# Patient Record
Sex: Male | Born: 1970 | Race: Black or African American | Hispanic: No | Smoking: Current every day smoker
Health system: Southern US, Community
[De-identification: ages and names within clinical notes are randomized; demographics above are authoritative.]

## PROBLEM LIST (undated history)

## (undated) HISTORY — PX: MANDIBLE SURGERY: SHX707

---

## 2016-10-09 ENCOUNTER — Emergency Department
Admission: EM | Admit: 2016-10-09 | Discharge: 2016-10-09 | Discharge status: Against Medical Advice | Payer: Self-pay | Attending: Emergency Medicine | Admitting: Emergency Medicine

## 2016-10-09 DIAGNOSIS — Z5321 Procedure and treatment not carried out due to patient leaving prior to being seen by health care provider: Secondary | ICD-10-CM | POA: Insufficient documentation

## 2016-10-09 DIAGNOSIS — R6884 Jaw pain: Secondary | ICD-10-CM | POA: Insufficient documentation

## 2016-10-09 MED ORDER — OXYCODONE-ACETAMINOPHEN 5-325 MG PO TABS
ORAL_TABLET | ORAL | Status: AC
  Filled 2016-10-09: qty 1

## 2016-10-09 MED ORDER — OXYCODONE-ACETAMINOPHEN 5-325 MG PO TABS
1.0000 | ORAL_TABLET | Freq: Once | ORAL | Status: AC
  Administered 2016-10-09: 1 via ORAL

## 2016-10-09 NOTE — ED Notes (Signed)
Pt called in lobby x 1 

## 2016-10-09 NOTE — ED Triage Notes (Signed)
Patient reports having jaw pain.  Patient reports pain in lower jaw from midline up into right ear.

## 2016-10-15 ENCOUNTER — Encounter: Payer: Self-pay | Admitting: Emergency Medicine

## 2016-10-15 ENCOUNTER — Emergency Department
Admission: EM | Admit: 2016-10-15 | Discharge: 2016-10-15 | Disposition: A | Discharge status: Home / Self Care | Payer: Self-pay | Attending: Emergency Medicine | Admitting: Emergency Medicine

## 2016-10-15 DIAGNOSIS — R6884 Jaw pain: Secondary | ICD-10-CM | POA: Insufficient documentation

## 2016-10-15 DIAGNOSIS — Z87891 Personal history of nicotine dependence: Secondary | ICD-10-CM | POA: Insufficient documentation

## 2016-10-15 MED ORDER — OXYCODONE-ACETAMINOPHEN 5-325 MG PO TABS
1.0000 | ORAL_TABLET | Freq: Once | ORAL | Status: AC
  Administered 2016-10-15: 1 via ORAL
  Filled 2016-10-15: qty 1

## 2016-10-15 MED ORDER — TRAMADOL HCL 50 MG PO TABS: 50.0000 mg | ORAL_TABLET | Freq: Four times a day (QID) | ORAL | 0 refills | Status: AC | PRN

## 2016-10-15 MED ORDER — TRAMADOL HCL 50 MG PO TABS: 50.0000 mg | ORAL_TABLET | Freq: Four times a day (QID) | ORAL | 0 refills | Status: DC | PRN

## 2016-10-15 MED ORDER — KETOROLAC TROMETHAMINE 60 MG/2ML IM SOLN: 60.0000 mg | Freq: Once | INTRAMUSCULAR | Status: DC

## 2016-10-15 NOTE — ED Provider Notes (Signed)
[facility] Provider Note  ____________________________________________  Time seen: Approximately 7:39 PM  I have reviewed the triage vital signs and the nursing notes.   HISTORY  Chief Complaint Jaw Pain    HPI Eddie Gardner is a 46 y.o. male presenting to the emergency department with chronic jaw pain. As I entered the room patient states, "I need something for pain". Patient states that he sustained multiple jaw fractures approximately 2 years ago and had bracing. He has not followed up with his oral surgeon in "several years". Patient states that he experiences 10 out of 10 jaw pain daily. He has been seeking care at numerous emergency departments in the attempts of obtaining Percocet. Patient has not yet established a pain management contract. Patient is interested in being referred to pain management. He has been afebrile. Patient denies increased gingival edema or erythema. Patient denies chest pain, chest tightness, shortness of breath,  nausea, abdominal pain and upper extremity pain.   History reviewed. No pertinent past medical history.  There are no active problems to display for this patient.   Past Surgical History:  Procedure Laterality Date  . MANDIBLE SURGERY      Prior to Admission medications   Medication Sig Start Date End Date Taking? Authorizing Provider  traMADol (ULTRAM) 50 MG tablet Take 1 tablet (50 mg total) by mouth every 6 (six) hours as needed. 10/15/16 10/18/16  Orvil Feil, PA-C    Allergies Patient has no known allergies.  No family history on file.  Social History Social History  Substance Use Topics  . Smoking status: Former Games developer  . Smokeless tobacco: Never Used  . Alcohol use No     Review of Systems  Constitutional: No fever/chills Eyes: No visual changes. No discharge ENT: No upper respiratory complaints. Cardiovascular: no chest pain. Respiratory: no cough. No  SOB. Gastrointestinal: No abdominal pain.  No nausea, no vomiting.  No diarrhea.  No constipation. Musculoskeletal: Patient has lower jaw pain.  Skin: Negative for rash, abrasions, lacerations, ecchymosis. Neurological: Negative for headaches, focal weakness or numbness. ____________________________________________   PHYSICAL EXAM:  VITAL SIGNS: ED Triage Vitals  Enc Vitals Group     BP 10/15/16 1839 133/63     Pulse Rate 10/15/16 1839 83     Resp 10/15/16 1839 18     Temp 10/15/16 1839 98.2 F (36.8 C)     Temp src --      SpO2 10/15/16 1839 99 %     Weight 10/15/16 1838 170 lb (77.1 kg)     Height 10/15/16 1838 5\' 10"  (1.778 m)     Head Circumference --      Peak Flow --      Pain Score 10/15/16 1838 10     Pain Loc --      Pain Edu? --      Excl. in GC? --     Constitutional: Alert and oriented. Patient is antagonistic and anxious throughout exam. Eyes: Palpebral and bulbar conjunctiva are nonerythematous bilaterally. PERRL. EOMI. No scleral icterus bilaterally. Head: Atraumatic. ENT:      Ears: Tympanic membranes are pearly bilaterally without effusion, erythema or purulent exudate. Bony landmarks are visualized bilaterally.       Nose: Skin overlying nares is without erythema. Nasal turbinates are non-erythematous. Nasal septum is midline.      Mouth/Throat: Mucous membranes are moist. Posterior pharynx is nonerythematous. No tonsillar exudate, hypertrophy or petechiae visualized. Uvula is midline. No gingival hypertrophy or  edema visualized. Teeth from inferior 32 to inferior 28 are missing. Patient has tenderness to palpation at inferior right jaw. Neck: Full range of motion. No pain with neck flexion. Hematological/Lymphatic/Immunilogical: No cervical lymphadenopathy.  Cardiovascular: No pain with palpation over the anterior and posterior chest wall. Normal rate, regular rhythm. Normal S1 and S2. No murmurs, gallops or rubs auscultated.  Respiratory: Trachea is  midline. No retractions or presence of deformity. Thoracic expansion is symmetric with unaccentuated tactile fremitus. Resonant and symmetric percussion tones bilaterally. On auscultation, adventitious sounds are absent.  Neurologic:  Normal for age. No gross focal neurologic deficits are appreciated.  Skin: Patient has no erythema or edema of the skin overlying the right jaw. No clubbing or cyanosis of the digits visualized.  Psychiatric: Mood and affect are normal for age. Speech and behavior are normal.   ____________________________________________   LABS (all labs ordered are listed, but only abnormal results are displayed)  Labs Reviewed - No data to display ____________________________________________  EKG   ____________________________________________  RADIOLOGY  No results found.  ____________________________________________    PROCEDURES  Procedure(s) performed:    Procedures    Medications  oxyCODONE-acetaminophen (PERCOCET/ROXICET) 5-325 MG per tablet 1 tablet (1 tablet Oral Given 10/15/16 2012)     ____________________________________________   INITIAL IMPRESSION / ASSESSMENT AND PLAN / ED COURSE  Pertinent labs & imaging results that were available during my care of the patient were reviewed by me and considered in my medical decision making (see chart for details).  Review of the [place] CSRS was performed in accordance of the [facility] prior to dispensing any controlled drugs.     Assessment and Plan: Chronic jaw pain: Patient presents to the emergency department with chronic jaw pain. After I discussed anti-inflammatory options with patient, he sought counsel with me at my desk outside of his exam room. Patient became very demanding for narcotics. I explained that I would provide one Percocet in the emergency department today and a 3 day course of tramadol to sustain his pain management needs until he can establish a relationship with a pain management  affiliation. I explained that [facility] emergency department was not obligated to provide narcotics for chronic pain. Patient voiced understanding. A referral was made to pain management. All patient questions were answered.  ___________________________________________  FINAL CLINICAL IMPRESSION(S) / ED DIAGNOSES  Final diagnoses:  Jaw pain      NEW MEDICATIONS STARTED DURING THIS VISIT:  Discharge Medication List as of 10/15/2016  7:51 PM          This chart was dictated using voice recognition software/Dragon. Despite best efforts to proofread, errors can occur which can change the meaning. Any change was purely unintentional.    Orvil FeilJaclyn M Marye Eagen, PA-C 10/15/16 2344    Arnaldo NatalPaul F Malinda, MD 10/16/16 [phone]

## 2016-10-15 NOTE — ED Notes (Signed)

## 2016-10-15 NOTE — ED Triage Notes (Addendum)
Pt c/o jaw pain. States every time "it gets cold and it rains it hurts". Pt reports hx of multiple fractures to R jaw where pain is.

## 2016-12-11 ENCOUNTER — Encounter: Payer: Self-pay | Admitting: *Deleted

## 2016-12-11 DIAGNOSIS — Z5321 Procedure and treatment not carried out due to patient leaving prior to being seen by health care provider: Secondary | ICD-10-CM | POA: Insufficient documentation

## 2016-12-11 DIAGNOSIS — K0889 Other specified disorders of teeth and supporting structures: Secondary | ICD-10-CM | POA: Insufficient documentation

## 2016-12-11 DIAGNOSIS — F17200 Nicotine dependence, unspecified, uncomplicated: Secondary | ICD-10-CM | POA: Insufficient documentation

## 2016-12-11 MED ORDER — IBUPROFEN 400 MG PO TABS
400.0000 mg | ORAL_TABLET | Freq: Once | ORAL | Status: AC | PRN
  Administered 2016-12-11: 400 mg via ORAL

## 2016-12-11 MED ORDER — IBUPROFEN 400 MG PO TABS
ORAL_TABLET | ORAL | Status: AC
  Filled 2016-12-11: qty 1

## 2016-12-11 NOTE — ED Triage Notes (Signed)
Pt has left lower toothache  Sx for 2 months.  Pt states pain worse today.  Pt alert.

## 2016-12-12 ENCOUNTER — Emergency Department
Admission: EM | Admit: 2016-12-12 | Discharge: 2016-12-13 | Disposition: A | Discharge status: Home / Self Care | Payer: Self-pay | Attending: Emergency Medicine | Admitting: Emergency Medicine

## 2016-12-12 NOTE — ED Notes (Signed)
Called pt to be taken to [facility]; no answer and pt not found in lobby.

## 2016-12-16 ENCOUNTER — Emergency Department
Admission: EM | Admit: 2016-12-16 | Discharge: 2016-12-16 | Disposition: A | Discharge status: Home / Self Care | Payer: Self-pay | Attending: Emergency Medicine | Admitting: Emergency Medicine

## 2016-12-16 DIAGNOSIS — K029 Dental caries, unspecified: Secondary | ICD-10-CM | POA: Insufficient documentation

## 2016-12-16 DIAGNOSIS — F17200 Nicotine dependence, unspecified, uncomplicated: Secondary | ICD-10-CM | POA: Insufficient documentation

## 2016-12-16 MED ORDER — LIDOCAINE VISCOUS 2 % MT SOLN
15.0000 mL | Freq: Once | OROMUCOSAL | Status: DC
Start: 2016-12-16 — End: 2016-12-16

## 2016-12-16 MED ORDER — AMOXICILLIN 500 MG PO TABS: 500.0000 mg | ORAL_TABLET | Freq: Two times a day (BID) | ORAL | 0 refills | Status: AC

## 2016-12-16 MED ORDER — IBUPROFEN 800 MG PO TABS: 800.0000 mg | ORAL_TABLET | Freq: Three times a day (TID) | ORAL | 0 refills | Status: DC | PRN

## 2016-12-16 MED ORDER — AMOXICILLIN 500 MG PO CAPS
500.0000 mg | ORAL_CAPSULE | Freq: Once | ORAL | Status: AC
  Administered 2016-12-16: 500 mg via ORAL
  Filled 2016-12-16: qty 1

## 2016-12-16 MED ORDER — LIDOCAINE VISCOUS 2 % MT SOLN
15.0000 mL | Freq: Once | OROMUCOSAL | Status: AC
  Administered 2016-12-16: 15 mL via OROMUCOSAL
  Filled 2016-12-16: qty 15

## 2016-12-16 MED ORDER — KETOROLAC TROMETHAMINE 10 MG PO TABS
10.0000 mg | ORAL_TABLET | Freq: Once | ORAL | Status: AC
  Administered 2016-12-16: 10 mg via ORAL
  Filled 2016-12-16: qty 1

## 2016-12-16 NOTE — ED Notes (Signed)
Dr Brown and Butch, RN at bedside at this time. 

## 2016-12-16 NOTE — ED Provider Notes (Signed)
[facility] Emergency Department Provider Note   First MD Initiated Contact with Patient 12/16/16 [phone]     (approximate)  I have reviewed the triage vital signs and the nursing notes.   HISTORY  Chief Complaint Dental Pain    HPI Eddie Gardner is a 46 y.o. male presents with left mandibular molar dental carry times few months with intermittent pain. Patient states this episode of pain has been severe tonight with a pain score is 10 out of 10. Patient denies any fever no difficulty swallowing.   Past medical history None There are no active problems to display for this patient.   Past Surgical History:  Procedure Laterality Date  . MANDIBLE SURGERY      Prior to Admission medications   Not on File    Allergies No known drug allergies History reviewed. No pertinent family history.  Social History Social History  Substance Use Topics  . Smoking status: Current Every Day Smoker  . Smokeless tobacco: Never Used  . Alcohol use No    Review of Systems Constitutional: No fever/chills Eyes: No visual changes. ENT: No sore throat.Positive for dental pain Cardiovascular: Denies chest pain. Respiratory: Denies shortness of breath. Gastrointestinal: No abdominal pain.  No nausea, no vomiting.  No diarrhea.  No constipation. Genitourinary: Negative for dysuria. Musculoskeletal: Negative for back pain. Skin: Negative for rash. Neurological: Negative for headaches, focal weakness or numbness.  10-point ROS otherwise negative.  ____________________________________________   PHYSICAL EXAM:  VITAL SIGNS: ED Triage Vitals  Enc Vitals Group     BP 12/16/16 0422 (!) 151/101     Pulse Rate 12/16/16 0422 (!) 54     Resp 12/16/16 0422 18     Temp 12/16/16 0422 98 F (36.7 C)     Temp Source 12/16/16 0422 Oral     SpO2 12/16/16 0422 98 %     Weight 12/16/16 0423 180 lb (81.6 kg)     Height 12/16/16 0423  (1.803 m)     Head  Circumference --      Peak Flow --      Pain Score 12/16/16 0422 10     Pain Loc --      Pain Edu? --      Excl. in GC? --     Constitutional: Alert and oriented. Well appearing and in no acute distress. Eyes: Conjunctivae are normal. PERRL. EOMI. Head: Atraumatic. Mouth/Throat: Mucous membranes are moist. Oropharynx non-erythematous.Left mandibular molar dental caries Neck: No stridor.  Cardiovascular: Normal rate, regular rhythm. Good peripheral circulation. Grossly normal heart sounds. Respiratory: Normal respiratory effort.  No retractions. Lungs CTAB. Gastrointestinal: Soft and nontender. No distention.  Musculoskeletal: No lower extremity tenderness nor edema. No gross deformities of extremities. Neurologic:  Normal speech and language. No gross focal neurologic deficits are appreciated.  Skin:  Skin is warm, dry and intact. No rash noted.     Procedures   ____________________________________________   INITIAL IMPRESSION / ASSESSMENT AND PLAN / ED COURSE  Pertinent labs & imaging results that were available during my care of the patient were reviewed by me and considered in my medical decision making (see chart for details).  Patient given viscous lidocaine swish and spit Toradol 10 mg tablet as well as amoxicillin 5 mg.      ____________________________________________  FINAL CLINICAL IMPRESSION(S) / ED DIAGNOSES  Final diagnoses:  None     MEDICATIONS GIVEN DURING THIS VISIT:  Medications  lidocaine (XYLOCAINE) 2 % viscous mouth solution  15 mL (not administered)  lidocaine (XYLOCAINE) 2 % viscous mouth solution 15 mL (15 mLs Mouth/Throat Given 12/16/16 0532)  amoxicillin (AMOXIL) capsule 500 mg (500 mg Oral Given 12/16/16 0532)  ketorolac (TORADOL) tablet 10 mg (10 mg Oral Given 12/16/16 0532)     NEW OUTPATIENT MEDICATIONS STARTED DURING THIS VISIT:  New Prescriptions   No medications on file    Modified Medications   No medications on file     Discontinued Medications   No medications on file     Note:  This document was prepared using Dragon voice recognition software and may include unintentional dictation errors.    Darci Current, MD 12/16/16 [phone]

## 2016-12-16 NOTE — ED Triage Notes (Signed)
Patient c/o lower left jaw pain for the last few months with increasing severity today.

## 2016-12-26 ENCOUNTER — Emergency Department
Admission: EM | Admit: 2016-12-26 | Discharge: 2016-12-26 | Disposition: A | Discharge status: Home / Self Care | Payer: Self-pay | Attending: Emergency Medicine | Admitting: Emergency Medicine

## 2016-12-26 DIAGNOSIS — K029 Dental caries, unspecified: Secondary | ICD-10-CM | POA: Insufficient documentation

## 2016-12-26 DIAGNOSIS — F17200 Nicotine dependence, unspecified, uncomplicated: Secondary | ICD-10-CM | POA: Insufficient documentation

## 2016-12-26 MED ORDER — ACETAMINOPHEN-CODEINE #3 300-30 MG PO TABS
ORAL_TABLET | ORAL | Status: AC
  Filled 2016-12-26: qty 1

## 2016-12-26 MED ORDER — ACETAMINOPHEN-CODEINE #3 300-30 MG PO TABS
1.0000 | ORAL_TABLET | Freq: Once | ORAL | Status: AC
  Administered 2016-12-26: 1 via ORAL

## 2016-12-26 MED ORDER — CLINDAMYCIN HCL 300 MG PO CAPS: 300.0000 mg | ORAL_CAPSULE | Freq: Three times a day (TID) | ORAL | 0 refills | Status: AC

## 2016-12-26 MED ORDER — ACETAMINOPHEN-CODEINE #2 300-15 MG PO TABS: 1.0000 | ORAL_TABLET | ORAL | 0 refills | Status: AC | PRN

## 2016-12-26 MED ORDER — LIDOCAINE VISCOUS 2 % MT SOLN: 10.0000 mL | OROMUCOSAL | 0 refills | Status: DC | PRN

## 2016-12-26 NOTE — ED Provider Notes (Signed)
[facility] Emergency Department Provider Note  ____________________________________________  Time seen: Approximately 10:24 PM  I have reviewed the triage vital signs and the nursing notes.   HISTORY  Chief Complaint Dental Pain    HPI Eddie [facility] is a 46 y.o. male that presents to the emergency department with lower left tooth pain that has increased in severity over the last week. Patient states that he was seen in ED 10 days ago for a fractured tooth and was given amoxicillin and ibuprofen for pain. Patient states that he has taken all of the amoxicillin and has been "popping ibuprofen like candy." Patient has made an appointment with a dentist but is not able to be seen for 2 weeks. He just wants his tooth pulled or something to make the pain go away. He denies fever, drainage from mouth, difficulty opening or closing mouth, shortness of breath, chest pain, nausea, vomiting, abdominal pain.   No past medical history on file.  There are no active problems to display for this patient.   Past Surgical History:  Procedure Laterality Date  . MANDIBLE SURGERY      Prior to Admission medications   Medication Sig Start Date End Date Taking? Authorizing Provider  acetaminophen-codeine (TYLENOL #2) 300-15 MG tablet Take 1 tablet by mouth every 4 (four) hours as needed for moderate pain. 12/26/16 12/26/17  Enid Derry, PA-C  amoxicillin (AMOXIL) 500 MG tablet Take 1 tablet (500 mg total) by mouth 2 (two) times daily. 12/16/16 12/26/16  Darci Current, MD  clindamycin (CLEOCIN) 300 MG capsule Take 1 capsule (300 mg total) by mouth 3 (three) times daily. 12/26/16 01/05/17  Enid Derry, PA-C  ibuprofen (ADVIL,MOTRIN) 800 MG tablet Take 1 tablet (800 mg total) by mouth every 8 (eight) hours as needed. 12/16/16   Darci Current, MD  lidocaine (XYLOCAINE) 2 % solution Use as directed 10 mLs in the mouth or throat as needed for mouth pain. 12/26/16   Enid Derry, PA-C     Allergies Patient has no known allergies.  No family history on file.  Social History Social History  Substance Use Topics  . Smoking status: Current Every Day Smoker  . Smokeless tobacco: Never Used  . Alcohol use No     Review of Systems  Constitutional: No fever/chills Cardiovascular: No chest pain. Respiratory: No cough. No SOB. Gastrointestinal: No abdominal pain.  No nausea, no vomiting.  Musculoskeletal: Negative for musculoskeletal pain. Skin: Negative for rash, abrasions, lacerations, ecchymosis. Neurological: Negative for headaches, numbness or tingling   ____________________________________________   PHYSICAL EXAM:  VITAL SIGNS: ED Triage Vitals  Enc Vitals Group     BP 12/26/16 2009 110/72     Pulse Rate 12/26/16 2009 (!) 51     Resp 12/26/16 2009 20     Temp 12/26/16 2009 98.8 F (37.1 C)     Temp Source 12/26/16 2009 Oral     SpO2 12/26/16 2009 95 %     Weight --      Height --      Head Circumference --      Peak Flow --      Pain Score 12/26/16 2008 10     Pain Loc --      Pain Edu? --      Excl. in GC? --      Constitutional: Alert and oriented.  Eyes: Conjunctivae are normal. PERRL. EOMI. Head: Atraumatic. ENT:      Ears:      Nose:  No congestion/rhinnorhea.      Mouth/Throat: Mucous membranes are moist. Large cavities in lower left molars. No swelling. No TMJ pain. No drainage from mouth. Neck: No stridor.  Cardiovascular: Normal rate, regular rhythm.  Good peripheral circulation. Respiratory: Normal respiratory effort without tachypnea or retractions. Lungs CTAB. Good air entry to the bases with no decreased or absent breath sounds. Musculoskeletal: Full range of motion to all extremities. No gross deformities appreciated. Neurologic:  Normal speech and language. No gross focal neurologic deficits are appreciated.  Skin:  Skin is warm, dry and intact. No rash noted. Psychiatric:  Agitated   ____________________________________________   LABS (all labs ordered are listed, but only abnormal results are displayed)  Labs Reviewed - No data to display ____________________________________________  EKG   ____________________________________________  RADIOLOGY  No results found.  ____________________________________________    PROCEDURES  Procedure(s) performed:    Procedures    Medications  acetaminophen-codeine (TYLENOL #3) 300-30 MG per tablet 1 tablet (1 tablet Oral Given 12/26/16 2220)     ____________________________________________   INITIAL IMPRESSION / ASSESSMENT AND PLAN / ED COURSE  Pertinent labs & imaging results that were available during my care of the patient were reviewed by me and considered in my medical decision making (see chart for details).  Review of the [place] CSRS was performed in accordance of the [facility] prior to dispensing any controlled drugs.   Patient's diagnosis is consistent with dental caries. Vital signs and exam are reassuring. Patient is very agitated and angry in ED. He threatened to punch a wall. At one point patient blocked the door from me leaving. Security was notified and was present with the patient and I. Patient states that he is "sick of telling everybody about his tooth and he is not going to continue repeating his story." Patient will be given a very short course of Tylenol 3 since he is taking excess ibuprofen and I am concerned for his stomach. Patient was instructed that he needs to call the dentist tomorrow and will not be given any more pain medicine from the ED. Patient has not been prescribed narcotics in the last 6 months according to the [facility] CSRS. Patient states that pain is worsening after finishing course of amoxicillin so he was switched to clindamycin. Patient will be discharged home with prescriptions for viscous lidocaine, short course of Tylenol 3, clindamycin. Patient is to follow up with  dentist as directed. Patient is given ED precautions to return to the ED for any worsening or new symptoms.     ____________________________________________  FINAL CLINICAL IMPRESSION(S) / ED DIAGNOSES  Final diagnoses:  Dental caries      NEW MEDICATIONS STARTED DURING THIS VISIT:  Discharge Medication List as of 12/26/2016 10:13 PM    START taking these medications   Details  acetaminophen-codeine (TYLENOL #2) 300-15 MG tablet Take 1 tablet by mouth every 4 (four) hours as needed for moderate pain., Starting Tue 12/26/2016, Until Wed 12/26/2017, Print    clindamycin (CLEOCIN) 300 MG capsule Take 1 capsule (300 mg total) by mouth 3 (three) times daily., Starting Tue 12/26/2016, Until Fri 01/05/2017, Print    lidocaine (XYLOCAINE) 2 % solution Use as directed 10 mLs in the mouth or throat as needed for mouth pain., Starting Tue 12/26/2016, Print            This chart was dictated using voice recognition software/Dragon. Despite best efforts to proofread, errors can occur which can change the meaning. Any change was purely unintentional.  Enid Derry, PA-C 12/26/16 2356    Phineas Semen, MD 12/27/16 [phone]

## 2016-12-26 NOTE — Discharge Instructions (Signed)
OPTIONS FOR DENTAL FOLLOW UP CARE ° °[place] Department of Health and Human Services - Local Safety Net Dental Clinics °http://www.ncdhhs.gov/dph/oralhealth/services/safetynetclinics.htm °  °[facility] (336-562-3123) ° °[facility] [place] ([phone]) ° °[facility] ([phone] ext [phone]) ° °[facility] ([phone]) ° °[facility] ([phone]) °This clinic caters to the indigent population and is on a lottery system. °Location: °[facility], [address], [place] °Clinic Hours: °Wednesdays from 6pm - 9pm, patients seen by a lottery system. °For dates, call or go to [email or web address].[facility].edu/[facility]/patients/Dental-[facility] °Services: °Cleanings, fillings and simple extractions. °Payment Options: °DENTAL WORK IS FREE OF CHARGE. Bring proof of income or support. °Best way to get seen: °Arrive at 5:15 pm - this is a lottery, NOT first come/first serve, so arriving earlier will not increase your chances of being seen. °  °  °[facility] °[phone] °Select option 1 for emergencies °  °Location: °[facility], [address], [place] °Clinic Hours: °No walk-ins accepted - call the day before to schedule an appointment. °Check in times are 9:30 am and 1:30 pm. °Services: °Simple extractions, temporary fillings, pulpectomy/pulp debridement, uncomplicated abscess drainage. °Payment Options: °PAYMENT IS DUE AT THE TIME OF SERVICE.  Fee is usually $100-200, additional surgical procedures (e.g. abscess drainage) may be extra. °Cash, checks, Visa/MasterCard accepted.  Can file Medicaid if patient is covered for dental - patient should call case worker to check. °No discount for [facility] patients. °Best way to get seen: °MUST call the day before and get onto the schedule. Can usually be seen the next 1-2 days. No walk-ins accepted. °  °  °[facility] °[phone] °   °Location: °[facility], [address], [place] °Clinic Hours: °M, W, Th, F 8am or 1:30pm, Tues 9a or 1:30 - first come/first served. °Services: °Simple extractions, temporary fillings, uncomplicated abscess drainage.  You do not need to be an [place] resident. °Payment Options: °PAYMENT IS DUE AT THE TIME OF SERVICE. °Dental insurance, otherwise sliding scale - bring proof of income or support. °Depending on income and treatment needed, cost is usually $50-200. °Best way to get seen: °Arrive early as it is first come/first served. °  °  °[facility] °[phone] °  °Location: °[address] °Clinic Hours: °Mon-Thu 8a-5p °Services: °Most basic dental services including extractions and fillings. °Payment Options: °PAYMENT IS DUE AT THE TIME OF SERVICE. °Sliding scale, up to 50% off - bring proof if income or support. °Medicaid with dental option accepted. °Best way to get seen: °Call to schedule an appointment, can usually be seen within 2 weeks OR they will try to see walk-ins - show up at 8a or 2p (you may have to wait). °  °  °[facility] °919-245-2435 °[place] RESIDENTS ONLY °  °Location: °[facility], [address], [place], [place] [address] °Clinic Hours: By appointment only. °Monday - Thursday 8am-5pm, Friday 8am-12pm °Services: Cleanings, fillings, extractions. °Payment Options: °PAYMENT IS DUE AT THE TIME OF SERVICE. °Cash, Visa or MasterCard. Sliding scale - $30 minimum per service. °Best way to get seen: °Come in to office, complete packet and make an appointment - need proof of income °or support monies for each household member and proof of [place] residence. °Usually takes about a month to get in. °  °  °[facility] °[phone] °  °Location: °[address]   [place] °Clinic Hours: Walk-in Urgent Care Dental Services are offered Monday-Friday  mornings only. °The numbers of emergencies accepted daily is limited to the number of °providers available. °Maximum 15 - Mondays, Wednesdays & Thursdays °Maximum 10 - Tuesdays & Fridays °Services: °You do not need to be a [place] resident to be seen for a dental emergency. °Emergencies are defined as pain, swelling, abnormal bleeding, or dental trauma. Walkins will receive x-rays if needed. °NOTE: Dental cleaning is not an emergency. °Payment Options: °PAYMENT IS DUE AT THE TIME OF SERVICE. °Minimum co-pay is $40.00 for uninsured patients. °Minimum co-pay is $3.00 for Medicaid with dental coverage. °Dental Insurance is accepted and must be presented at time of visit. °Medicare does not cover dental. °Forms of payment: Cash, credit card, checks. °Best way to get seen: °If not previously registered with the clinic, walk-in dental registration begins at 7:15 am and is on a first come/first serve basis. °If previously registered with the clinic, call to make an appointment. °  °  °[facility] °[phone] °[place] RESIDENTS ONLY °  °Location: °[address], [place], [place] °Clinic Hours: °Mon-Thu 10a-2p °Services: Extractions only! °Payment Options: °FREE (donations accepted) - bring proof of income or support °Best way to get seen: °Call and schedule an appointment OR come at 8am on the 1st Monday of every month (except for holidays) when it is first come/first served. °  °  °[facility] °[phone] °  °Location: °[address], [place] °Clinic Hours: °Friday mornings °Services, Payment Options, Best way to get seen: °Call for info °

## 2016-12-26 NOTE — ED Triage Notes (Addendum)
Patient reports dental pain to left lower due to "cracked tooth".  Patient reports pain has increased in severity. Reports seen in ED a week ago and ibuprofen is not helping and he can't get an appointment for 2 weeks.

## 2016-12-26 NOTE — ED Notes (Signed)
Patient was yelling and cursing at EDP. Called security.

## 2017-01-09 ENCOUNTER — Ambulatory Visit: Payer: Self-pay

## 2017-01-28 ENCOUNTER — Emergency Department
Admission: EM | Admit: 2017-01-28 | Discharge: 2017-01-28 | Disposition: A | Discharge status: Home / Self Care | Payer: Self-pay | Attending: Emergency Medicine | Admitting: Emergency Medicine

## 2017-01-28 ENCOUNTER — Encounter: Payer: Self-pay | Admitting: Emergency Medicine

## 2017-01-28 DIAGNOSIS — F17200 Nicotine dependence, unspecified, uncomplicated: Secondary | ICD-10-CM | POA: Insufficient documentation

## 2017-01-28 DIAGNOSIS — K0889 Other specified disorders of teeth and supporting structures: Secondary | ICD-10-CM

## 2017-01-28 DIAGNOSIS — K029 Dental caries, unspecified: Secondary | ICD-10-CM | POA: Insufficient documentation

## 2017-01-28 DIAGNOSIS — Z79899 Other long term (current) drug therapy: Secondary | ICD-10-CM | POA: Insufficient documentation

## 2017-01-28 MED ORDER — LIDOCAINE VISCOUS 2 % MT SOLN
15.0000 mL | Freq: Once | OROMUCOSAL | Status: AC
  Administered 2017-01-28: 15 mL via OROMUCOSAL
  Filled 2017-01-28: qty 15

## 2017-01-28 MED ORDER — TRAMADOL HCL 50 MG PO TABS
50.0000 mg | ORAL_TABLET | Freq: Once | ORAL | Status: AC
  Administered 2017-01-28: 50 mg via ORAL
  Filled 2017-01-28: qty 1

## 2017-01-28 MED ORDER — LIDOCAINE VISCOUS 2 % MT SOLN: 5.0000 mL | Freq: Four times a day (QID) | OROMUCOSAL | 0 refills | Status: DC | PRN

## 2017-01-28 MED ORDER — TRAMADOL HCL 50 MG PO TABS: 50.0000 mg | ORAL_TABLET | Freq: Four times a day (QID) | ORAL | 0 refills | Status: DC | PRN

## 2017-01-28 MED ORDER — IBUPROFEN 600 MG PO TABS
600.0000 mg | ORAL_TABLET | Freq: Once | ORAL | Status: AC
  Administered 2017-01-28: 600 mg via ORAL
  Filled 2017-01-28: qty 1

## 2017-01-28 NOTE — ED Triage Notes (Signed)
Pt ambulated with steady gait into triage room. Pt complains of dental/jaw pain that has been persistent for the last "5 month." Pt reports no relief with otc medication.

## 2017-01-28 NOTE — Discharge Instructions (Signed)
Advised to follow-up with walk-in dental clinic within the next 48 hours.

## 2017-01-28 NOTE — ED Provider Notes (Signed)
[facility] Emergency Department Provider Note   ____________________________________________   First MD Initiated Contact with Patient 01/28/17 2120     (approximate)  I have reviewed the triage vital signs and the nursing notes.   HISTORY  Chief Complaint Dental Pain    HPI Eddie Gardner is a 46 y.o. male patient complaining of dental jaw pain for 5 months. This is the patient 4th visit in 3 months for the same complaint. Patient has not follow-up with dental clinic as advised. Discussed with patient rationale to seek definitive dental care because the emergency department cannot provide definitive care for his complaint. Patient rates his pain as a 10 over 10. Patient described a pain as "achy". States no relief over-the-counter medications. And has a history of right mandible reconstruction.  History reviewed. No pertinent past medical history.  There are no active problems to display for this patient.   Past Surgical History:  Procedure Laterality Date  . MANDIBLE SURGERY      Prior to Admission medications   Medication Sig Start Date End Date Taking? Authorizing Provider  acetaminophen-codeine (TYLENOL #2) 300-15 MG tablet Take 1 tablet by mouth every 4 (four) hours as needed for moderate pain. 12/26/16 12/26/17  Enid Derry, PA-C  ibuprofen (ADVIL,MOTRIN) 800 MG tablet Take 1 tablet (800 mg total) by mouth every 8 (eight) hours as needed. 12/16/16   Darci Current, MD  lidocaine (XYLOCAINE) 2 % solution Use as directed 10 mLs in the mouth or throat as needed for mouth pain. 12/26/16   Enid Derry, PA-C  lidocaine (XYLOCAINE) 2 % solution Use as directed 5 mLs in the mouth or throat every 6 (six) hours as needed for mouth pain. 01/28/17   Joni Reining, PA-C  traMADol (ULTRAM) 50 MG tablet Take 1 tablet (50 mg total) by mouth every 6 (six) hours as needed. 01/28/17   Joni Reining, PA-C    Allergies Patient has no known allergies.  No  family history on file.  Social History Social History  Substance Use Topics  . Smoking status: Current Every Day Smoker  . Smokeless tobacco: Never Used  . Alcohol use No    Review of Systems  Constitutional: No fever/chills Eyes: No visual changes. ENT: No sore throat. Dental pain Cardiovascular: Denies chest pain. Respiratory: Denies shortness of breath. Gastrointestinal: No abdominal pain.  No nausea, no vomiting.  No diarrhea.  No constipation. Genitourinary: Negative for dysuria. Musculoskeletal: Negative for back pain. Skin: Negative for rash. Neurological: Negative for headaches, focal weakness or numbness.   ____________________________________________   PHYSICAL EXAM:  VITAL SIGNS: ED Triage Vitals [01/28/17 2059]  Enc Vitals Group     BP 137/88     Pulse Rate 67     Resp 18     Temp 98.8 F (37.1 C)     Temp Source Oral     SpO2 98 %     Weight 165 lb (74.8 kg)     Height 5\' 10"  (1.778 m)     Head Circumference      Peak Flow      Pain Score 10     Pain Loc      Pain Edu?      Excl. in GC?     Constitutional: Alert and oriented. Well appearing and in no acute distress. Mouth/Throat: Mucous membranes are moist.  Oropharynx non-erythematous. Multiple caries and fracture to the right lower molar area. Neck: No stridor.  No cervical spine tenderness  to palpation. Hematological/Lymphatic/Immunilogical: No cervical lymphadenopathy. Cardiovascular: Normal rate, regular rhythm. Grossly normal heart sounds.  Good peripheral circulation. Respiratory: Normal respiratory effort.  No retractions. Lungs CTAB.   ____________________________________________   LABS (all labs ordered are listed, but only abnormal results are displayed)  Labs Reviewed - No data to  display ____________________________________________  EKG   ____________________________________________  RADIOLOGY   ____________________________________________   PROCEDURES  Procedure(s) performed: None  Procedures  Critical Care performed: No  ____________________________________________   INITIAL IMPRESSION / ASSESSMENT AND PLAN / ED COURSE  Pertinent labs & imaging results that were available during my care of the patient were reviewed by me and considered in my medical decision making (see chart for details).  Dental pain secondary to fractured multiple caries. Patient given discharge Instructions and advised to follow-up with walking dental clinic tomorrow morning.      ____________________________________________   FINAL CLINICAL IMPRESSION(S) / ED DIAGNOSES  Final diagnoses:  Pain, dental      NEW MEDICATIONS STARTED DURING THIS VISIT:  New Prescriptions   LIDOCAINE (XYLOCAINE) 2 % SOLUTION    Use as directed 5 mLs in the mouth or throat every 6 (six) hours as needed for mouth pain.   TRAMADOL (ULTRAM) 50 MG TABLET    Take 1 tablet (50 mg total) by mouth every 6 (six) hours as needed.     Note:  This document was prepared using Dragon voice recognition software and may include unintentional dictation errors.    Joni ReiningSmith, Ronald K, PA-C 01/28/17 2137    Schaevitz, Myra Rudeavid Matthew, MD 01/28/17 [id number]

## 2017-01-29 ENCOUNTER — Emergency Department
Admission: EM | Admit: 2017-01-29 | Discharge: 2017-01-29 | Disposition: A | Discharge status: Home / Self Care | Payer: Self-pay | Attending: Emergency Medicine | Admitting: Emergency Medicine

## 2017-01-29 ENCOUNTER — Emergency Department: Payer: Self-pay

## 2017-01-29 DIAGNOSIS — R079 Chest pain, unspecified: Secondary | ICD-10-CM | POA: Insufficient documentation

## 2017-01-29 DIAGNOSIS — J029 Acute pharyngitis, unspecified: Secondary | ICD-10-CM | POA: Insufficient documentation

## 2017-01-29 DIAGNOSIS — M549 Dorsalgia, unspecified: Secondary | ICD-10-CM | POA: Insufficient documentation

## 2017-01-29 DIAGNOSIS — R42 Dizziness and giddiness: Secondary | ICD-10-CM | POA: Insufficient documentation

## 2017-01-29 DIAGNOSIS — F17200 Nicotine dependence, unspecified, uncomplicated: Secondary | ICD-10-CM | POA: Insufficient documentation

## 2017-01-29 DIAGNOSIS — Z5321 Procedure and treatment not carried out due to patient leaving prior to being seen by health care provider: Secondary | ICD-10-CM | POA: Insufficient documentation

## 2017-01-29 LAB — BASIC METABOLIC PANEL
Anion gap: 8 (ref 5–15)
BUN: 9 mg/dL (ref 6–20)
CALCIUM: 9.4 mg/dL (ref 8.9–10.3)
CO2: 28 mmol/L (ref 22–32)
CREATININE: 1.2 mg/dL (ref 0.61–1.24)
Chloride: 108 mmol/L (ref 101–111)
GFR calc non Af Amer: >60 mL/min (ref >60)
GLUCOSE: 88 mg/dL (ref 65–99)
Potassium: 4.1 mmol/L (ref 3.5–5.1)
Sodium: 144 mmol/L (ref 135–145)

## 2017-01-29 LAB — TROPONIN I

## 2017-01-29 LAB — CBC
HCT: 43.6 % (ref 40.0–52.0)
HEMOGLOBIN: 14.9 g/dL (ref 13.0–18.0)
MCH: 30.3 pg (ref 26.0–34.0)
MCHC: 34.3 g/dL (ref 32.0–36.0)
MCV: 88.5 fL (ref 80.0–100.0)
PLATELETS: 179 10*3/uL (ref 150–440)
RBC: 4.92 MIL/uL (ref 4.40–5.90)
RDW: 13.4 % (ref 11.5–14.5)
WBC: 8.9 10*3/uL (ref 3.8–10.6)

## 2017-01-29 NOTE — ED Notes (Signed)
No answer when called from lobby 

## 2017-01-29 NOTE — ED Triage Notes (Signed)
Pt presents to ED c/o severe chest pain , lightheaded, "throat is closing up" , sore throat, and back pain for a couple of days

## 2017-01-30 ENCOUNTER — Telehealth: Payer: Self-pay | Admitting: Emergency Medicine

## 2017-01-30 NOTE — Telephone Encounter (Signed)
Called patient due to lwot to inquire about condition and follow up plans. Patient would not come to phone.  I told them to let him know that we called to check on him.

## 2017-07-07 ENCOUNTER — Emergency Department
Admission: EM | Admit: 2017-07-07 | Discharge: 2017-07-07 | Disposition: A | Discharge status: Home / Self Care | Payer: Self-pay | Attending: Emergency Medicine | Admitting: Emergency Medicine

## 2017-07-07 ENCOUNTER — Encounter: Payer: Self-pay | Admitting: Emergency Medicine

## 2017-07-07 DIAGNOSIS — F17200 Nicotine dependence, unspecified, uncomplicated: Secondary | ICD-10-CM | POA: Insufficient documentation

## 2017-07-07 DIAGNOSIS — Z79899 Other long term (current) drug therapy: Secondary | ICD-10-CM | POA: Insufficient documentation

## 2017-07-07 DIAGNOSIS — K029 Dental caries, unspecified: Secondary | ICD-10-CM | POA: Insufficient documentation

## 2017-07-07 DIAGNOSIS — K0889 Other specified disorders of teeth and supporting structures: Secondary | ICD-10-CM

## 2017-07-07 MED ORDER — TRAMADOL HCL 50 MG PO TABS: 50.0000 mg | ORAL_TABLET | Freq: Four times a day (QID) | ORAL | 0 refills | Status: DC | PRN

## 2017-07-07 MED ORDER — OXYCODONE-ACETAMINOPHEN 5-325 MG PO TABS
1.0000 | ORAL_TABLET | Freq: Once | ORAL | Status: AC
  Administered 2017-07-07: 1 via ORAL
  Filled 2017-07-07: qty 1

## 2017-07-07 MED ORDER — LIDOCAINE VISCOUS 2 % MT SOLN: 5.0000 mL | Freq: Four times a day (QID) | OROMUCOSAL | 0 refills | Status: DC | PRN

## 2017-07-07 NOTE — ED Notes (Signed)
RN reviewed d/c instructions and prescriptions with patient. Patient verbalized understanding. Patient's heart rate increased from triage heart rate. RN informed patient she would discuss with MD before discharge.

## 2017-07-07 NOTE — ED Notes (Signed)
Patient up and agitated, pacing room at time of vital signs. MD informed of patient's HR. MD order to discharge patient.

## 2017-07-07 NOTE — ED Notes (Signed)
Patient left room and this ED. Patient took discharge instructions, however refused to take prescriptions. Patient refused to sign for discharge.

## 2017-07-07 NOTE — ED Provider Notes (Signed)
[facility] Emergency Department Provider Note  ____________________________________________   First MD Initiated Contact with Patient 07/07/17 0533     (approximate)  I have reviewed the triage vital signs and the nursing notes.   HISTORY  Chief Complaint Dental Pain   HPI Evangeline Dakinndre Castello is a 46 y.o. male returning to the emergency department for ongoing dental pain.  He says that he had a jaw fracture over a year ago and now has return of his pain to the right side of his mouth radiating down his neck and also back to his ear and up into his head.  He says the pain is severe.  He says that he has been unable to be treated by dental because of financial issues.  He is not reporting any fever.  History reviewed. No pertinent past medical history.  There are no active problems to display for this patient.   Past Surgical History:  Procedure Laterality Date  . MANDIBLE SURGERY      Prior to Admission medications   Medication Sig Start Date End Date Taking? Authorizing Provider  acetaminophen-codeine (TYLENOL #2) 300-15 MG tablet Take 1 tablet by mouth every 4 (four) hours as needed for moderate pain. 12/26/16 12/26/17  Enid DerryWagner, Ashley, PA-C  ibuprofen (ADVIL,MOTRIN) 800 MG tablet Take 1 tablet (800 mg total) by mouth every 8 (eight) hours as needed. 12/16/16   Darci CurrentBrown, [place], MD  lidocaine (XYLOCAINE) 2 % solution Use as directed 10 mLs in the mouth or throat as needed for mouth pain. 12/26/16   Enid DerryWagner, Ashley, PA-C  lidocaine (XYLOCAINE) 2 % solution Use as directed 5 mLs in the mouth or throat every 6 (six) hours as needed for mouth pain. 01/28/17   Joni ReiningSmith, Ronald K, PA-C  traMADol (ULTRAM) 50 MG tablet Take 1 tablet (50 mg total) by mouth every 6 (six) hours as needed. 01/28/17   Joni ReiningSmith, Ronald K, PA-C    Allergies Patient has no known allergies.  No family history on file.  Social History Social History  Substance Use Topics  . Smoking status: Current  Every Day Smoker  . Smokeless tobacco: Never Used  . Alcohol use No    Review of Systems  Constitutional: No fever/chills Eyes: No visual changes. ENT: No sore throat. Cardiovascular: Denies chest pain. Respiratory: Denies shortness of breath. Gastrointestinal: No abdominal pain.  No nausea, no vomiting.  No diarrhea.  No constipation. Genitourinary: Negative for dysuria. Musculoskeletal: Negative for back pain. Skin: Negative for rash. Neurological: Negative for headaches, focal weakness or numbness.   ____________________________________________   PHYSICAL EXAM:  VITAL SIGNS: ED Triage Vitals  Enc Vitals Group     BP 07/07/17 0500 (!) 165/103     Pulse Rate 07/07/17 0500 (!) 57     Resp 07/07/17 0500 18     Temp 07/07/17 0500 98.8 F (37.1 C)     Temp Source 07/07/17 0500 Oral     SpO2 07/07/17 0500 100 %     Weight 07/07/17 0458 155 lb (70.3 kg)     Height 07/07/17 0458 5\' 10"  (1.778 m)     Head Circumference --      Peak Flow --      Pain Score 07/07/17 0457 10     Pain Loc --      Pain Edu? --      Excl. in GC? --     Constitutional: Alert and oriented. Well appearing and in no acute distress. Eyes: Conjunctivae are normal.  Head: Atraumatic. Nose: No congestion/rhinnorhea. Mouth/Throat: Mucous membranes are moist.  Patient without any swelling around his gums.  No gross facial swelling noted.  Dental erosion throughout.  No trismus.  Speaking normal voice and controlling his secretions. Neck: No stridor.   Cardiovascular: Normal rate, regular rhythm. Grossly normal heart sounds.  Respiratory: Normal respiratory effort.  No retractions. Lungs CTAB. Gastrointestinal: Soft and nontender. No distention.  Musculoskeletal: No lower extremity tenderness nor edema.  No joint effusions. Neurologic:  Normal speech and language. No gross focal neurologic deficits are appreciated. Skin:  Skin is warm, dry and intact. No rash noted. Psychiatric: Mood and affect are  normal. Speech and behavior are normal.  ____________________________________________   LABS (all labs ordered are listed, but only abnormal results are displayed)  Labs Reviewed - No data to display ____________________________________________  EKG   ____________________________________________  RADIOLOGY   ____________________________________________   PROCEDURES  Procedure(s) performed:   Procedures  Critical Care performed:   ____________________________________________   INITIAL IMPRESSION / ASSESSMENT AND PLAN / ED COURSE  Pertinent labs & imaging results that were available during my care of the patient were reviewed by me and considered in my medical decision making (see chart for details).  DDX: Dental abscess, chronic dental pain, dental caries,  As part of my medical decision making, I reviewed the following data within the electronic MEDICAL RECORD NUMBER Notes from prior ED visits  Patient says that he needs something "stronger than tramadol."  I offered him a Percocet before he leaves and then a prescription for tramadol.  He then squeezed his Styrofoam cup with ice in it until the cup broke into pieces on the ice when all over the floor.  Patient will be referred to the dental clinic.  He is understanding the plan willing to comply.      ____________________________________________   FINAL CLINICAL IMPRESSION(S) / ED DIAGNOSES  Dental caries.  Dental pain.    NEW MEDICATIONS STARTED DURING THIS VISIT:  New Prescriptions   No medications on file     Note:  This document was prepared using Dragon voice recognition software and may include unintentional dictation errors.     Myrna Blazer, MD 07/07/17 [phone]

## 2017-07-07 NOTE — ED Triage Notes (Signed)
Pt arrives ambulatory to triage with c/o right dental pain which has been going on for 18 months. Pt reports that his jaw was broken and that spiraled into a constant pain. Pt is in NAD at this time.

## 2017-07-07 NOTE — ED Notes (Signed)
Patient asking for a different provider. RN explained that patient's cannot change doctor's per policy. Patient requesting to speak with MD supervisor. Charge RN informed.

## 2017-07-07 NOTE — ED Notes (Signed)
Patient reports he will not take prescribed pain medications. Patient further reports that they do not help his pain. Patient handed prescription back to this RN. Charge RN informed. Charge RN to bedside.

## 2017-07-10 ENCOUNTER — Emergency Department
Admission: EM | Admit: 2017-07-10 | Discharge: 2017-07-10 | Disposition: A | Discharge status: Home / Self Care | Payer: Self-pay | Attending: Emergency Medicine | Admitting: Emergency Medicine

## 2017-07-10 DIAGNOSIS — F17200 Nicotine dependence, unspecified, uncomplicated: Secondary | ICD-10-CM | POA: Insufficient documentation

## 2017-07-10 DIAGNOSIS — K047 Periapical abscess without sinus: Secondary | ICD-10-CM | POA: Insufficient documentation

## 2017-07-10 MED ORDER — IBUPROFEN 800 MG PO TABS
800.0000 mg | ORAL_TABLET | Freq: Once | ORAL | Status: AC
  Administered 2017-07-10: 800 mg via ORAL
  Filled 2017-07-10: qty 1

## 2017-07-10 MED ORDER — LIDOCAINE VISCOUS 2 % MT SOLN: 20.0000 mL | OROMUCOSAL | 0 refills | Status: DC | PRN

## 2017-07-10 MED ORDER — IBUPROFEN 800 MG PO TABS: 800.0000 mg | ORAL_TABLET | Freq: Four times a day (QID) | ORAL | 0 refills | Status: DC | PRN

## 2017-07-10 MED ORDER — CLINDAMYCIN HCL 300 MG PO CAPS: 300.0000 mg | ORAL_CAPSULE | Freq: Three times a day (TID) | ORAL | 0 refills | Status: AC

## 2017-07-10 MED ORDER — DIPHENHYDRAMINE HCL 12.5 MG/5ML PO ELIX
25.0000 mg | ORAL_SOLUTION | Freq: Once | ORAL | Status: AC
  Administered 2017-07-10: 25 mg via ORAL
  Filled 2017-07-10: qty 10

## 2017-07-10 MED ORDER — LIDOCAINE VISCOUS 2 % MT SOLN
15.0000 mL | Freq: Once | OROMUCOSAL | Status: AC
  Administered 2017-07-10: 15 mL via OROMUCOSAL
  Filled 2017-07-10: qty 15

## 2017-07-10 MED ORDER — CLINDAMYCIN HCL 150 MG PO CAPS
300.0000 mg | ORAL_CAPSULE | Freq: Once | ORAL | Status: AC
  Administered 2017-07-10: 300 mg via ORAL
  Filled 2017-07-10: qty 2

## 2017-07-10 NOTE — ED Triage Notes (Signed)
Patient reports having an abscess tooth.  Reports seen here before for same but states he wants something for pain.

## 2017-07-10 NOTE — ED Provider Notes (Signed)
[facility] Emergency Department Provider Note  ____________________________________________  Time seen: Approximately 7:22 AM  I have reviewed the triage vital signs and the nursing notes.   HISTORY  Chief Complaint Abscess   HPI Abed Schar is a 46 y.o. male no significant past medical history who presents for evaluation of dental pain. Patient was seen here 44 days ago and recommended to follow up with a dentist however has not made any attempts to schedule an appointment at this time. He is here asking for pain medication since the tramadol is not working. No fever or chills, no difficulty handling his saliva, no difficulty opening his mouth. He reports that the pain is constant, located in the right lower molar region, severe and nonradiating.  No past medical history on file.  There are no active problems to display for this patient.   Past Surgical History:  Procedure Laterality Date  . MANDIBLE SURGERY      Prior to Admission medications   Medication Sig Start Date End Date Taking? Authorizing Provider  acetaminophen-codeine (TYLENOL #2) 300-15 MG tablet Take 1 tablet by mouth every 4 (four) hours as needed for moderate pain. 12/26/16 12/26/17  Enid Derry, PA-C  clindamycin (CLEOCIN) 300 MG capsule Take 1 capsule (300 mg total) by mouth 3 (three) times daily. 07/10/17 07/20/17  Nita Sickle, MD  ibuprofen (ADVIL,MOTRIN) 800 MG tablet Take 1 tablet (800 mg total) by mouth every 6 (six) hours as needed. 07/10/17   Nita Sickle, MD  lidocaine (XYLOCAINE) 2 % solution Use as directed 20 mLs in the mouth or throat as needed for mouth pain. 07/10/17   Nita Sickle, MD  traMADol (ULTRAM) 50 MG tablet Take 1 tablet (50 mg total) by mouth every 6 (six) hours as needed. 07/07/17 07/07/18  Schaevitz, Myra Rude, MD    Allergies Patient has no known allergies.  No family history on file.  Social History Social History    Substance Use Topics  . Smoking status: Current Every Day Smoker  . Smokeless tobacco: Never Used  . Alcohol use No    Review of Systems  Constitutional: Negative for fever. Eyes: Negative for visual changes. ENT: Negative for sore throat. + dental pain Neck: No neck pain  Cardiovascular: Negative for chest pain. Respiratory: Negative for shortness of breath. Gastrointestinal: Negative for abdominal pain, vomiting or diarrhea. Genitourinary: Negative for dysuria. Musculoskeletal: Negative for back pain. Skin: Negative for rash. Neurological: Negative for headaches, weakness or numbness. Psych: No SI or HI  ____________________________________________   PHYSICAL EXAM:  VITAL SIGNS: ED Triage Vitals  Enc Vitals Group     BP 07/10/17 0458 138/84     Pulse Rate 07/10/17 0458 71     Resp 07/10/17 0458 20     Temp 07/10/17 0458 98.3 F (36.8 C)     Temp src --      SpO2 07/10/17 0458 96 %     Weight --      Height --      Head Circumference --      Peak Flow --      Pain Score 07/10/17 0456 10     Pain Loc --      Pain Edu? --      Excl. in GC? --     Constitutional: Alert and oriented. Well appearing and in no apparent distress. HEENT:      Head: Normocephalic and atraumatic.         Eyes: Conjunctivae are normal. Sclera  is non-icteric.       Mouth/Throat: Mucous membranes are moist. Patient has a developing dental abscess located in the R lower molar region. No trismus, floor of the mouth is soft with no induration, handling saliva. Several cavities      Neck: Supple with no signs of meningismus. Cardiovascular: Regular rate and rhythm. No murmurs, gallops, or rubs. 2+ symmetrical distal pulses are present in all extremities. No JVD. Respiratory: Normal respiratory effort. Lungs are clear to auscultation bilaterally. No wheezes, crackles, or rhonchi.  Neurologic: Normal speech and language. Face is symmetric. Moving all extremities. No gross focal neurologic  deficits are appreciated. Skin: Skin is warm, dry and intact. No rash noted. Psychiatric: Mood and affect are normal. Speech and behavior are normal.  ____________________________________________   LABS (all labs ordered are listed, but only abnormal results are displayed)  Labs Reviewed - No data to display ____________________________________________  EKG  none  ____________________________________________  RADIOLOGY  none  ____________________________________________   PROCEDURES  Procedure(s) performed: None Procedures Critical Care performed:  None ____________________________________________   INITIAL IMPRESSION / ASSESSMENT AND PLAN / ED COURSE  46 y.o. male no significant past medical history who presents for evaluation of dental pain. Patient was seen here 44 days ago and recommended to follow-up with the dentist. Patient has not made an attempt to schedule an appointment at this time. He is here requesting stronger pain medication than tramadol. I explained to the patient that the only thing that will take care of his pain is to have his tooth removed and the abscess drained. I again provided him with a list of several dental providers in the area. I will start patient on antibiotics since the infection seems to be progressing. Patient will be given prescription for ibuprofen 800 mg, clindamycin. For pain patient was also given viscous lidocaine and liquid Benadryl for topical relief. Discussed return precautions for progression of the infection such as trismus, difficulty handling his saliva, redness or induration of the neck area. Recommend he return to the emergency room if these develop.      As part of my medical decision making, I reviewed the following data within the electronic MEDICAL RECORD NUMBER Nursing notes reviewed and incorporated, Old chart reviewed, Notes from prior ED visits and [place] Controlled Substance Database    Pertinent labs & imaging results that  were available during my care of the patient were reviewed by me and considered in my medical decision making (see chart for details).    ____________________________________________   FINAL CLINICAL IMPRESSION(S) / ED DIAGNOSES  Final diagnoses:  Dental abscess      NEW MEDICATIONS STARTED DURING THIS VISIT:  New Prescriptions   CLINDAMYCIN (CLEOCIN) 300 MG CAPSULE    Take 1 capsule (300 mg total) by mouth 3 (three) times daily.   IBUPROFEN (ADVIL,MOTRIN) 800 MG TABLET    Take 1 tablet (800 mg total) by mouth every 6 (six) hours as needed.   LIDOCAINE (XYLOCAINE) 2 % SOLUTION    Use as directed 20 mLs in the mouth or throat as needed for mouth pain.     Note:  This document was prepared using Dragon voice recognition software and may include unintentional dictation errors.    Nita SickleVeronese, [place], MD 07/10/17 [id number]

## 2017-07-10 NOTE — ED Notes (Signed)
AAOx3.  Skin warm and dry.  NAD 

## 2017-07-10 NOTE — Discharge Instructions (Signed)
Pain: take 800mg  of ibuprofen every 6 hours with meals or snacks. Mix 25ml of viscous lidocaine with 25ml of liquid benadryl and apply over the abscess with cotton balls.  You need to see a dentist as soon as possible to avoid this infection from spreading.  Return to the ER for difficulty opening your mouth, swelling, redness of your neck, or fever.   OPTIONS FOR DENTAL FOLLOW UP CARE  [place] Department of Health and Human Services - Local Safety Net Dental Clinics TripDoors.comhttp://www.ncdhhs.gov/dph/oralhealth/services/safetynetclinics.htm   [facility] [phone])  [facility] [phone])  [facility] [place] [phone])  [facility] [phone])  [facility] [phone]) This clinic caters to the indigent population and is on a lottery system. Location: [employer], [facility], [address], [place] [facility] Hours: Wednesdays from 6pm - 9pm, patients seen by a lottery system. For dates, call or go to [email or web address] Services: Cleanings, fillings and simple extractions. Payment Options: DENTAL WORK IS FREE OF CHARGE. Bring proof of income or support. Best way to get seen: Arrive at 5:15 pm - this is a lottery, NOT first come/first serve, so arriving earlier will not increase your chances of being seen.     [facility] [phone] Select option 1 for emergencies   Location: [facility], [place], [address], [place] [facility] Hours: No walk-ins accepted - call the day before to schedule an appointment. Check in times are 9:30 am and 1:30 pm. Services: Simple extractions, temporary fillings, pulpectomy/pulp debridement, uncomplicated abscess drainage. Payment Options: PAYMENT IS DUE AT THE TIME OF SERVICE.  Fee is usually $100-200, additional surgical procedures (e.g. abscess drainage) may be extra. Cash, checks,  Visa/MasterCard accepted.  Can file Medicaid if patient is covered for dental - patient should call case worker to check. No discount for [facility] patients. Best way to get seen: MUST call the day before and get onto the schedule. Can usually be seen the next 1-2 days. No walk-ins accepted.     [facility] [phone]   Location: [facility], [address], [place] Clinic Hours: M, W, Th, F 8am or 1:30pm, Tues 9a or 1:30 - first come/first served. Services: Simple extractions, temporary fillings, uncomplicated abscess drainage.  You do not need to be an [facility] resident. Payment Options: PAYMENT IS DUE AT THE TIME OF SERVICE. Dental insurance, otherwise sliding scale - bring proof of income or support. Depending on income and treatment needed, cost is usually $50-200. Best way to get seen: Arrive early as it is first come/first served.     [facility] [phone]   Location: [address] Clinic Hours: Mon-Thu 8a-5p Services: Most basic dental services including extractions and fillings. Payment Options: PAYMENT IS DUE AT THE TIME OF SERVICE. Sliding scale, up to 50% off - bring proof if income or support. Medicaid with dental option accepted. Best way to get seen: Call to schedule an appointment, can usually be seen within 2 weeks OR they will try to see walk-ins - show up at 8a or 2p (you may have to wait).     [facility] [phone] [place]IDENTS ONLY   Location: [facility], [address], [place], [place] [address] Clinic Hours: By appointment only. Monday - Thursday 8am-5pm, Friday 8am-12pm Services: Cleanings, fillings, extractions. Payment Options: PAYMENT IS DUE AT THE TIME OF SERVICE. Cash, [employer]isa  or MasterCard. Sliding scale - $30 minimum per service. Best way to get seen: Come in to office, complete packet and  make an appointment - need proof of income or support monies for each household member and proof of [facility] residence. Usually takes about a month to get in.     [facility] [phone]   Location: [address], [facility] Clinic Hours: Walk-in Urgent Care Dental Services are offered Monday-Friday mornings only. The numbers of emergencies accepted daily is limited to the number of providers available. Maximum 15 - Mondays, Wednesdays & Thursdays Maximum 10 - Tuesdays & Fridays Services: You do not need to be a [facility] resident to be seen for a dental emergency. Emergencies are defined as pain, swelling, abnormal bleeding, or dental trauma. Walkins will receive x-rays if needed. NOTE: Dental cleaning is not an emergency. Payment Options: PAYMENT IS DUE AT THE TIME OF SERVICE. Minimum co-pay is $40.00 for uninsured patients. Minimum co-pay is $3.00 for Medicaid with dental coverage. Dental Insurance is accepted and must be presented at time of visit. Medicare does not cover dental. Forms of payment: Cash, credit card, checks. Best way to get seen: If not previously registered with the clinic, walk-in dental registration begins at 7:15 am and is on a first come/first serve basis. If previously registered with the clinic, call to make an appointment.     [facility] [phone] [place] RESIDENTS ONLY   Location: [address] [place], [place] Clinic Hours: Mon-Thu 10a-2p Services: Extractions only! Payment Options: FREE (donations accepted) - bring proof of income or support Best way to get seen: Call and schedule an appointment OR come at 8am on the 1st Monday of every month (except for holidays) when it is first come/first served.     [facility] [phone]   Location: [address] [place], [place] Clinic Hours: Friday mornings Services, Payment Options, Best way to get seen: Call for info

## 2017-09-23 DIAGNOSIS — J209 Acute bronchitis, unspecified: Secondary | ICD-10-CM | POA: Insufficient documentation

## 2017-09-23 DIAGNOSIS — F17200 Nicotine dependence, unspecified, uncomplicated: Secondary | ICD-10-CM | POA: Insufficient documentation

## 2017-09-23 NOTE — ED Triage Notes (Signed)
Patient reports having a cough, pain in upper chest with cough and lower back pain for couple days.

## 2017-09-24 ENCOUNTER — Emergency Department: Payer: Self-pay

## 2017-09-24 ENCOUNTER — Emergency Department
Admission: EM | Admit: 2017-09-24 | Discharge: 2017-09-24 | Disposition: A | Discharge status: Home / Self Care | Payer: Self-pay | Attending: Emergency Medicine | Admitting: Emergency Medicine

## 2017-09-24 DIAGNOSIS — J209 Acute bronchitis, unspecified: Secondary | ICD-10-CM

## 2017-09-24 MED ORDER — HYDROCOD POLST-CPM POLST ER 10-8 MG/5ML PO SUER: 5.0000 mL | Freq: Two times a day (BID) | ORAL | 0 refills | Status: DC | PRN

## 2017-09-24 MED ORDER — AZITHROMYCIN 500 MG PO TABS: 500.0000 mg | ORAL_TABLET | Freq: Every day | ORAL | 0 refills | Status: AC

## 2017-09-24 MED ORDER — AZITHROMYCIN 500 MG PO TABS
500.0000 mg | ORAL_TABLET | Freq: Once | ORAL | Status: AC
  Administered 2017-09-24: 500 mg via ORAL
  Filled 2017-09-24: qty 1

## 2017-09-24 MED ORDER — HYDROCOD POLST-CPM POLST ER 10-8 MG/5ML PO SUER
5.0000 mL | Freq: Once | ORAL | Status: AC
  Administered 2017-09-24: 5 mL via ORAL
  Filled 2017-09-24: qty 5

## 2017-09-24 NOTE — ED Provider Notes (Signed)
[facility] Emergency Department Provider Note    First MD Initiated Contact with Patient 09/24/17 [phone]     (approximate)  I have reviewed the triage vital signs and the nursing notes.   HISTORY  Chief Complaint Back Pain and Cough    HPI Eddie Gardner is a 47 y.o. male to the emergency department with 2-day history of nonproductive cough with chest and back discomfort with coughing.  Patient denies any fever.  Current pain score 7 out of 10 with coughing no pain without.  1 pack/day cigarette use,   Past medical history None There are no active problems to display for this patient.   Past Surgical History:  Procedure Laterality Date  . MANDIBLE SURGERY      Prior to Admission medications   Medication Sig Start Date End Date Taking? Authorizing Provider  acetaminophen-codeine (TYLENOL #2) 300-15 MG tablet Take 1 tablet by mouth every 4 (four) hours as needed for moderate pain. 12/26/16 12/26/17  Enid Derry, PA-C  ibuprofen (ADVIL,MOTRIN) 800 MG tablet Take 1 tablet (800 mg total) by mouth every 6 (six) hours as needed. 07/10/17   Nita Sickle, MD  lidocaine (XYLOCAINE) 2 % solution Use as directed 20 mLs in the mouth or throat as needed for mouth pain. 07/10/17   Nita Sickle, MD  traMADol (ULTRAM) 50 MG tablet Take 1 tablet (50 mg total) by mouth every 6 (six) hours as needed. 07/07/17 07/07/18  Schaevitz, Myra Rude, MD    Allergies No Known drug allergies No family history on file.  Social History Social History   Tobacco Use  . Smoking status: Current Every Day Smoker  . Smokeless tobacco: Never Used  Substance Use Topics  . Alcohol use: No  . Drug use: No    Review of Systems Constitutional: No fever/chills Eyes: No visual changes. ENT: No sore throat. Cardiovascular: Denies chest pain. Respiratory: Denies shortness of breath. Gastrointestinal: No abdominal pain.  No nausea, no vomiting.  No diarrhea.  No  constipation. Genitourinary: Negative for dysuria. Musculoskeletal: Negative for neck pain.  Negative for back pain. Integumentary: Negative for rash. Neurological: Negative for headaches, focal weakness or numbness.   ____________________________________________   PHYSICAL EXAM:  VITAL SIGNS: ED Triage Vitals  Enc Vitals Group     BP 09/23/17 2352 134/74     Pulse Rate 09/23/17 2352 70     Resp 09/23/17 2352 18     Temp 09/23/17 2352 98.9 F (37.2 C)     Temp Source 09/23/17 2352 Oral     SpO2 09/23/17 2352 97 %     Weight 09/23/17 2352 81.2 kg (179 lb)     Height 09/23/17 2352 1.778 m (5\' 10" )     Head Circumference --      Peak Flow --      Pain Score 09/23/17 2355 10     Pain Loc --      Pain Edu? --      Excl. in GC? --     Constitutional: Alert and oriented. Well appearing and in no acute distress. Eyes: Conjunctivae are normal. Head: Atraumatic. Nose: No congestion/rhinnorhea. Mouth/Throat: Mucous membranes are moist.  Oropharynx non-erythematous. Neck: No stridor.  Cardiovascular: Normal rate, regular rhythm. Good peripheral circulation. Grossly normal heart sounds. Respiratory: Normal respiratory effort.  No retractions. Lungs CTAB. Gastrointestinal: Soft and nontender. No distention.  Musculoskeletal: No lower extremity tenderness nor edema. No gross deformities of extremities. Neurologic:  Normal speech and language. No gross focal neurologic  deficits are appreciated.  Skin:  Skin is warm, dry and intact. No rash noted.    RADIOLOGY I, [place] N BROWN, personally viewed and evaluated these images (plain radiographs) as part of my medical decision making, as well as reviewing the written report by the radiologist.  Dg Chest 2 View  Result Date: 09/24/2017 CLINICAL DATA:  Cough and upper chest pain for few days. EXAM: CHEST  2 VIEW COMPARISON:  Chest radiograph Jan 29, 2017 FINDINGS: Cardiomediastinal silhouette is normal. No pleural effusions or focal  consolidations. Trachea projects midline and there is no pneumothorax. Soft tissue planes and included osseous structures are non-suspicious. IMPRESSION: Negative. Electronically Signed   By: Awilda Metroourtnay  Bloomer M.D.   On: 09/24/2017 00:36      Procedures   ____________________________________________   INITIAL IMPRESSION / ASSESSMENT AND PLAN / ED COURSE  As part of my medical decision making, I reviewed the following data within the electronic MEDICAL RECORD [id number] year old male presented with above-stated history and physical exam concerning for acute bronchitis.  Consider the possibility of pneumonia and as such chest x-ray was performed which was negative ____________________________________________  FINAL CLINICAL IMPRESSION(S) / ED DIAGNOSES  Final diagnoses:  Acute bronchitis, unspecified organism     MEDICATIONS GIVEN DURING THIS VISIT:  Medications  chlorpheniramine-HYDROcodone (TUSSIONEX) 10-8 MG/5ML suspension 5 mL (not administered)     ED Discharge Orders    None       Note:  This document was prepared using Dragon voice recognition software and may include unintentional dictation errors.    Darci CurrentBrown, [place] N, MD 09/24/17 [phone]

## 2017-10-11 ENCOUNTER — Emergency Department
Admission: EM | Admit: 2017-10-11 | Discharge: 2017-10-11 | Disposition: A | Discharge status: Home / Self Care | Payer: Self-pay | Attending: Student in an Organized Health Care Education/Training Program | Admitting: Student in an Organized Health Care Education/Training Program

## 2017-10-11 ENCOUNTER — Encounter: Payer: Self-pay | Admitting: Emergency Medicine

## 2017-10-11 DIAGNOSIS — K029 Dental caries, unspecified: Secondary | ICD-10-CM | POA: Insufficient documentation

## 2017-10-11 DIAGNOSIS — F17200 Nicotine dependence, unspecified, uncomplicated: Secondary | ICD-10-CM | POA: Insufficient documentation

## 2017-10-11 MED ORDER — TRAMADOL HCL 50 MG PO TABS: 50.0000 mg | ORAL_TABLET | Freq: Two times a day (BID) | ORAL | 0 refills | Status: DC

## 2017-10-11 MED ORDER — TRAMADOL HCL 50 MG PO TABS
100.0000 mg | ORAL_TABLET | Freq: Once | ORAL | Status: AC
  Administered 2017-10-11: 100 mg via ORAL
  Filled 2017-10-11: qty 2

## 2017-10-11 NOTE — ED Provider Notes (Signed)
[facility] Emergency Department Provider Note ____________________________________________  Time seen: 2141  I have reviewed the triage vital signs and the nursing notes.  HISTORY  Chief Complaint  Dental Pain  HPI Eddie Gardner is a 47 y.o. male presents to the ED And advised male friend, for evaluation of dental pain.  Patient describes pain to the left lower jaw at a chronically broken first molar.  Patient reports he is currently on a course of clindamycin prescribed by [facility].  His male companion relates that he has been on several courses of antibiotics from the same clinic.  He has not either presented with findings necessary to have an extraction performed or has failed to follow-up as discussed.  He denies any fevers, chills, sweats.  He is requesting pain control at this time.  History reviewed. No pertinent past medical history.  There are no active problems to display for this patient.   Past Surgical History:  Procedure Laterality Date  . MANDIBLE SURGERY      Prior to Admission medications   Medication Sig Start Date End Date Taking? Authorizing Provider  acetaminophen-codeine (TYLENOL #2) 300-15 MG tablet Take 1 tablet by mouth every 4 (four) hours as needed for moderate pain. 12/26/16 12/26/17  Enid Derry, PA-C  chlorpheniramine-HYDROcodone (TUSSIONEX PENNKINETIC ER) 10-8 MG/5ML SUER Take 5 mLs by mouth every 12 (twelve) hours as needed. 09/24/17   Darci Current, MD  ibuprofen (ADVIL,MOTRIN) 800 MG tablet Take 1 tablet (800 mg total) by mouth every 6 (six) hours as needed. 07/10/17   Nita Sickle, MD  lidocaine (XYLOCAINE) 2 % solution Use as directed 20 mLs in the mouth or throat as needed for mouth pain. 07/10/17   Nita Sickle, MD  traMADol (ULTRAM) 50 MG tablet Take 1 tablet (50 mg total) by mouth every 6 (six) hours as needed. 07/07/17 07/07/18  Schaevitz, Myra Rude, MD    Allergies Patient has no known  allergies.  No family history on file.  Social History Social History   Tobacco Use  . Smoking status: Current Every Day Smoker  . Smokeless tobacco: Never Used  Substance Use Topics  . Alcohol use: No  . Drug use: No    Review of Systems  Constitutional: Negative for fever. Eyes: Negative for visual changes. ENT: Negative for sore throat.  Dental pain as above. Cardiovascular: Negative for chest pain. Respiratory: Negative for shortness of breath. Gastrointestinal: Negative for abdominal pain, vomiting and diarrhea. Musculoskeletal: Negative for back pain. Neurological: Negative for headaches, focal weakness or numbness. ____________________________________________  PHYSICAL EXAM:  VITAL SIGNS: ED Triage Vitals  Enc Vitals Group     BP 10/11/17 2118 (!) 162/81     Pulse Rate 10/11/17 2118 67     Resp 10/11/17 2118 18     Temp 10/11/17 2118 98.5 F (36.9 C)     Temp Source 10/11/17 2118 Oral     SpO2 10/11/17 2118 99 %     Weight 10/11/17 2115 179 lb (81.2 kg)     Height 10/11/17 2115 5\' 10"  (1.778 m)     Head Circumference --      Peak Flow --      Pain Score 10/11/17 2115 10     Pain Loc --      Pain Edu? --      Excl. in GC? --     Constitutional: Alert and oriented. Well appearing and in no distress. Head: Normocephalic and atraumatic. Eyes: Conjunctivae are normal. Normal extraocular  movements Ears: Canals clear. TMs intact bilaterally. Mouth/Throat: Mucous membranes are moist.  Uvula is midline and tonsils are flat.  No oropharyngeal lesions are appreciated.  Patient with chronic dental caries noted to several teeth.  The left lower jaw reveals a second molar that is chronically broken to the level of the gum.  No focal gum swelling, fluctuance, or pointing is appreciated.  No brawny, sublingual edema is appreciated.   Cardiovascular: Normal rate, regular rhythm. Normal distal pulses. Respiratory: Normal respiratory effort. No  wheezes/rales/rhonchi. Musculoskeletal: Nontender with normal range of motion in all extremities.  Neurologic:  Normal gait without ataxia. Normal speech and language. No gross focal neurologic deficits are appreciated. ____________________________________________  PROCEDURES  Procedures Ultram 100 mg PO ____________________________________________  INITIAL IMPRESSION / ASSESSMENT AND PLAN / ED COURSE  Patient with a ED presentation for dental pain.  Patient reports left lower jaw dental pain secondary to chronic dental caries fractured tooth.  He is currently on a course of antibiotics by his report.  Patient was offered a dental block and refused citing his fear of needles.  He is given a single p.o. dose of Ultram in the ED, and a small prescription for Ultram #10 is provided at this time for pain relief.  He is advised to continue to dose the previously prescribed antibiotics.  A list of dental providers is given to the patient for referral for definitive management. ____________________________________________  FINAL CLINICAL IMPRESSION(S) / ED DIAGNOSES  Final diagnoses:  Pain due to dental caries      Salley Boxley, Charlesetta IvoryJenise V Bacon, PA-C 10/11/17 2151    Karmen StabsMenshew, Charlesetta IvoryJenise V Bacon, PA-C 10/11/17 2204    Willy Eddyobinson, Patrick, MD 10/11/17 2213

## 2017-10-11 NOTE — ED Notes (Signed)
Pt states he has seen a dentist and they prescribed him antibiotics.

## 2017-10-11 NOTE — ED Notes (Signed)
Pt presents with dental pain that started last year. Pt states pain is worsened and un bearable. Pt is A&Ox4 awaiting EDP.

## 2017-10-11 NOTE — ED Triage Notes (Signed)
Pt comes into the ED via POV c/o LLQ dental pain.  Patient in NAD at this time with even and unlabored respirations.

## 2017-10-11 NOTE — Discharge Instructions (Signed)
You should continue to dose the prescription antibiotics as prescribed. Follow-up with one of the dental clinics listed below for dental extraction.   OPTIONS FOR DENTAL FOLLOW UP CARE  [place] Department of Health and Human Services - Local Safety Net Dental Clinics TripDoors.comhttp://www.ncdhhs.gov/dph/oralhealth/services/safetynetclinics.htm   [facility] [phone])  [facility] [phone])  [facility] [place] [phone])  [facility] [phone])  [facility] [phone]) This clinic caters to the indigent population and is on a lottery system. Location: [employer], [address] [employer] [facility], [address], [place] Clinic Hours: Wednesdays from 6pm - 9pm, patients seen by a lottery system. For dates, call or go to [email or web address] Services: Cleanings, fillings and simple extractions. Payment Options: DENTAL WORK IS FREE OF CHARGE. Bring proof of income or support. Best way to get seen: Arrive at 5:15 pm - this is a lottery, NOT first come/first serve, so arriving earlier will not increase your chances of being seen.     [facility] [phone] Select option 1 for emergencies   Location: [facility], [place], [address], [place] Clinic Hours: No walk-ins accepted - call the day before to schedule an appointment. Check in times are 9:30 am and 1:30 pm. Services: Simple extractions, temporary fillings, pulpectomy/pulp debridement, uncomplicated abscess drainage. Payment Options: PAYMENT IS DUE AT THE TIME OF SERVICE.  Fee is usually $100-200, additional surgical procedures (e.g. abscess drainage) may be extra. Cash, checks, Visa/MasterCard accepted.  Can file Medicaid if patient is covered for dental - patient should call case worker to check. No discount for [facility] patients. Best way to get seen: MUST call the day  before and get onto the schedule. Can usually be seen the next 1-2 days. No walk-ins accepted.     [facility] [phone]   Location: [facility], [address], [place] Clinic Hours: M, W, Th, F 8am or 1:30pm, Tues 9a or 1:30 - first come/first served. Services: Simple extractions, temporary fillings, uncomplicated abscess drainage.  You do not need to be an [facility] [place] resident. Payment Options: PAYMENT IS DUE AT THE TIME OF SERVICE. Dental insurance, otherwise sliding scale - bring proof of income or support. Depending on income and treatment needed, cost is usually $50-200. Best way to get seen: Arrive early as it is first come/first served.     [facility] [phone]   Location: [address] Clinic Hours: Mon-Thu 8a-5p Services: Most basic dental services including extractions and fillings. Payment Options: PAYMENT IS DUE AT THE TIME OF SERVICE. Sliding scale, up to 50% off - bring proof if income or support. Medicaid with dental option accepted. Best way to get seen: Call to schedule an appointment, can usually be seen within 2 weeks OR they will try to see walk-ins - show up at 8a or 2p (you may have to wait).     [facility] [phone] [place] RESIDENTS ONLY   Location: [facility], [address], [place], [place] [address] Clinic Hours: By appointment only. Monday - Thursday 8am-5pm, Friday 8am-12pm Services: Cleanings, fillings, extractions. Payment Options: PAYMENT IS DUE AT THE TIME OF SERVICE. Cash, Visa or MasterCard. Sliding scale - $30 minimum per service. Best way to get seen: Come in to office, complete packet and make an appointment - need proof of income or support monies for each household member and proof of [facility] [place] [place] residence.  Usually takes about a month to get in.     [facility] [phone]   Location: [address], [place] Clinic Hours: Walk-in Urgent Care Dental Services are offered Monday-Friday mornings only. The numbers of emergencies accepted daily is limited to the number of providers available. Maximum 15 - Mondays, Wednesdays & Thursdays Maximum 10 - Tuesdays & Fridays Services: You do not need to be a [facility] resident to be seen for a dental emergency. Emergencies are defined as pain, swelling, abnormal bleeding, or dental trauma. Walkins will receive x-rays if needed. NOTE: Dental cleaning is not an emergency. Payment Options: PAYMENT IS DUE AT THE TIME OF SERVICE. Minimum co-pay is $40.00 for uninsured patients. Minimum co-pay is $3.00 for Medicaid with dental coverage. Dental Insurance is accepted and must be presented at time of visit. Medicare does not cover dental. Forms of payment: Cash, credit card, checks. Best way to get seen: If not previously registered with the clinic, walk-in dental registration begins at 7:15 am and is on a first come/first serve basis. If previously registered with the clinic, call to make an appointment.     [facility] [place] ONLY   Location: [address] [place], [place] Clinic Hours: Mon-Thu 10a-2p Services: Extractions only! Payment Options: FREE (donations accepted) - bring proof of income or support Best way to get seen: Call and schedule an appointment OR come at 8am on the 1st Monday of every month (except for holidays) when it is first come/first served.     [facility] [phone]   Location: [place] Clinic Hours: Friday mornings Services, Payment Options, Best way to get seen: Call for info

## 2017-12-22 ENCOUNTER — Emergency Department
Admission: EM | Admit: 2017-12-22 | Discharge: 2017-12-22 | Disposition: A | Discharge status: Home / Self Care | Payer: Self-pay | Attending: Student in an Organized Health Care Education/Training Program | Admitting: Student in an Organized Health Care Education/Training Program

## 2017-12-22 DIAGNOSIS — R42 Dizziness and giddiness: Secondary | ICD-10-CM

## 2017-12-22 DIAGNOSIS — Y929 Unspecified place or not applicable: Secondary | ICD-10-CM | POA: Insufficient documentation

## 2017-12-22 DIAGNOSIS — Z79899 Other long term (current) drug therapy: Secondary | ICD-10-CM | POA: Insufficient documentation

## 2017-12-22 DIAGNOSIS — S0990XA Unspecified injury of head, initial encounter: Secondary | ICD-10-CM | POA: Insufficient documentation

## 2017-12-22 DIAGNOSIS — F17210 Nicotine dependence, cigarettes, uncomplicated: Secondary | ICD-10-CM | POA: Insufficient documentation

## 2017-12-22 DIAGNOSIS — Y939 Activity, unspecified: Secondary | ICD-10-CM | POA: Insufficient documentation

## 2017-12-22 DIAGNOSIS — S0990XD Unspecified injury of head, subsequent encounter: Secondary | ICD-10-CM

## 2017-12-22 DIAGNOSIS — Y999 Unspecified external cause status: Secondary | ICD-10-CM | POA: Insufficient documentation

## 2017-12-22 LAB — BASIC METABOLIC PANEL
Anion gap: 7 (ref 5–15)
BUN: 14 mg/dL (ref 6–20)
CALCIUM: 8.9 mg/dL (ref 8.9–10.3)
CO2: 24 mmol/L (ref 22–32)
CREATININE: 1.02 mg/dL (ref 0.61–1.24)
Chloride: 109 mmol/L (ref 101–111)
Glucose, Bld: 96 mg/dL (ref 65–99)
Potassium: 3.2 mmol/L — ABNORMAL LOW (ref 3.5–5.1)
Sodium: 140 mmol/L (ref 135–145)

## 2017-12-22 LAB — CBC
HCT: 42.4 % (ref 40.0–52.0)
Hemoglobin: 14.3 g/dL (ref 13.0–18.0)
MCH: 29.7 pg (ref 26.0–34.0)
MCHC: 33.6 g/dL (ref 32.0–36.0)
MCV: 88.2 fL (ref 80.0–100.0)
PLATELETS: 200 10*3/uL (ref 150–440)
RBC: 4.81 MIL/uL (ref 4.40–5.90)
RDW: 13.4 % (ref 11.5–14.5)
WBC: 7.6 10*3/uL (ref 3.8–10.6)

## 2017-12-22 NOTE — Discharge Instructions (Addendum)
Please be sure to call the above listed offices for a follow-up visit, especially if your headaches, nausea, irritability, confusion persist.  Please call your physician or return to ED if you have: 1. Worsening or change in headaches. 2. Changes in vision. 3. New-onset nausea and vomiting. 4. Numbness, tingling, weakness in your extremities,. 4. Inability to eat or drink adequate amounts of food or liquids. 5. Chest pain, shortness of breath, or difficulty breathing. 6. Neurological changes- dizziness, fainting, loss of function of your arms, legs or other parts of your body. 7. Uncontrolled hypertension. 8. Or any other emergent concerns.
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
## 2018-02-09 ENCOUNTER — Other Ambulatory Visit: Payer: Self-pay

## 2018-02-09 ENCOUNTER — Encounter: Payer: Self-pay | Admitting: *Deleted

## 2018-02-09 ENCOUNTER — Emergency Department
Admission: EM | Admit: 2018-02-09 | Discharge: 2018-02-09 | Disposition: A | Discharge status: Home / Self Care | Payer: Self-pay | Attending: Emergency Medicine | Admitting: Emergency Medicine

## 2018-02-09 DIAGNOSIS — R6884 Jaw pain: Secondary | ICD-10-CM | POA: Insufficient documentation

## 2018-02-09 DIAGNOSIS — Z5321 Procedure and treatment not carried out due to patient leaving prior to being seen by health care provider: Secondary | ICD-10-CM | POA: Insufficient documentation

## 2018-02-09 NOTE — ED Triage Notes (Signed)
Pt reports right lower jaw pain that extends to his right ear tonight.

## 2018-07-09 ENCOUNTER — Encounter: Payer: Self-pay | Admitting: Emergency Medicine

## 2018-07-09 ENCOUNTER — Other Ambulatory Visit: Payer: Self-pay

## 2018-07-09 DIAGNOSIS — K029 Dental caries, unspecified: Secondary | ICD-10-CM | POA: Insufficient documentation

## 2018-07-09 DIAGNOSIS — Z79899 Other long term (current) drug therapy: Secondary | ICD-10-CM | POA: Insufficient documentation

## 2018-07-09 DIAGNOSIS — F17200 Nicotine dependence, unspecified, uncomplicated: Secondary | ICD-10-CM | POA: Insufficient documentation

## 2018-07-09 NOTE — ED Triage Notes (Signed)
Patient ambulatory to triage with steady gait, without difficulty or distress noted; pt reports left lower dental pain x yr

## 2018-07-10 ENCOUNTER — Other Ambulatory Visit: Payer: Self-pay

## 2018-07-10 ENCOUNTER — Emergency Department
Admission: EM | Admit: 2018-07-10 | Discharge: 2018-07-10 | Disposition: A | Discharge status: Home / Self Care | Payer: Self-pay | Attending: Emergency Medicine | Admitting: Emergency Medicine

## 2018-07-10 DIAGNOSIS — K0889 Other specified disorders of teeth and supporting structures: Secondary | ICD-10-CM

## 2018-07-10 DIAGNOSIS — K029 Dental caries, unspecified: Secondary | ICD-10-CM

## 2018-07-10 MED ORDER — PENICILLIN V POTASSIUM 500 MG PO TABS: 500.0000 mg | ORAL_TABLET | Freq: Four times a day (QID) | ORAL | 0 refills | Status: DC

## 2018-07-10 MED ORDER — PENICILLIN V POTASSIUM 250 MG PO TABS
500.0000 mg | ORAL_TABLET | Freq: Once | ORAL | Status: AC
  Administered 2018-07-10: 500 mg via ORAL
  Filled 2018-07-10: qty 2

## 2018-07-10 MED ORDER — OXYCODONE-ACETAMINOPHEN 5-325 MG PO TABS
2.0000 | ORAL_TABLET | Freq: Once | ORAL | Status: AC
  Administered 2018-07-10: 2 via ORAL
  Filled 2018-07-10: qty 2

## 2018-07-10 MED ORDER — DOCUSATE SODIUM 100 MG PO CAPS: ORAL_CAPSULE | ORAL | 0 refills | Status: DC

## 2018-07-10 MED ORDER — OXYCODONE-ACETAMINOPHEN 5-325 MG PO TABS: 1.0000 | ORAL_TABLET | Freq: Four times a day (QID) | ORAL | 0 refills | Status: DC | PRN

## 2018-07-10 NOTE — Discharge Instructions (Signed)
You have been seen in the Emergency Department (ED) today for dental pain.  Please take your prescribed antibiotic.  You may take pain medication as needed but ONLY as prescribed.  You should also take over-the-counter pain medication such as ibuprofen according to the label instructions unless a doctor has previously told you to avoid this type of medication (due to stomach ulcers, for example).  Alternatively you can take ibuprofen 600 mg by mouth three times daily with meals for no more than 5 days. ° °Take Percocet as prescribed for severe pain. Do not drink alcohol, drive or participate in any other potentially dangerous activities while taking this medication as it may make you sleepy. Do not take this medication with any other sedating medications, either prescription or over-the-counter. If you were prescribed Percocet or Vicodin, do not take these with acetaminophen (Tylenol) as it is already contained within these medications. °  °This medication is an opiate (or narcotic) pain medication and can be habit forming.  Use it as little as possible to achieve adequate pain control.  Do not use or use it with extreme caution if you have a history of opiate abuse or dependence.  If you are on a pain contract with your primary care doctor or a pain specialist, be sure to let them know you were prescribed this medication today from the [facility].  This medication is intended for your use only - do not give any to anyone else and keep it in a secure place where nobody else, especially children, have access to it.  It will also cause or worsen constipation, so you may want to consider taking an over-the-counter stool softener while you are taking this medication. ° °Please see you dentist as soon as possible; only a dentist will be able to fix your problem(s).  Please see below for dental follow up options. ° °Return to the ED if you develop worsening pain, fever, pus/drainage, difficulty  breathing, or other symptoms that concern you. °

## 2018-07-10 NOTE — ED Provider Notes (Signed)
[facility] Emergency Department Provider Note  ____________________________________________   First MD Initiated Contact with Patient 07/10/18 0222     (approximate)  I have reviewed the triage vital signs and the nursing notes.   HISTORY  Chief Complaint Dental Pain    HPI Eddie Gardner is a 47 y.o. male with no chronic medical issues who presents for evaluation of acute worsening of chronic left lower dental pain.  He has had dental pain and cavities for more than a year.  He says he does not have dental insurance and does not have enough money to go to the dentist.  Over the last few days he has had some swelling in the area, pain is worse with eating, drinking, and hot and cold foods/beverages.  No difficulty swallowing.  No swelling or pain radiating down into the neck.  No difficulty speaking or handling his secretions.  He denies fever/chills, chest pain, shortness of breath, nausea, vomiting, and abdominal pain.  Nothing particular makes the pain better including over-the-counter ibuprofen which she has been taking at home.  No past medical history on file.  There are no active problems to display for this patient.   Past Surgical History:  Procedure Laterality Date  . MANDIBLE SURGERY      Prior to Admission medications   Medication Sig Start Date End Date Taking? Authorizing Provider  chlorpheniramine-HYDROcodone (TUSSIONEX PENNKINETIC ER) 10-8 MG/5ML SUER Take 5 mLs by mouth every 12 (twelve) hours as needed. 09/24/17   Darci Current, MD  docusate sodium (COLACE) 100 MG capsule Take 1 tablet once or twice daily as needed for constipation while taking narcotic pain medicine 07/10/18   Loleta Rose, MD  ibuprofen (ADVIL,MOTRIN) 800 MG tablet Take 1 tablet (800 mg total) by mouth every 6 (six) hours as needed. 07/10/17   Nita Sickle, MD  lidocaine (XYLOCAINE) 2 % solution Use as directed 20 mLs in the mouth or throat as needed for mouth  pain. 07/10/17   Nita Sickle, MD  oxyCODONE-acetaminophen (PERCOCET) 5-325 MG tablet Take 1-2 tablets by mouth every 6 (six) hours as needed for severe pain. 07/10/18   Loleta Rose, MD  penicillin v potassium (VEETID) 500 MG tablet Take 1 tablet (500 mg total) by mouth 4 (four) times daily. 07/10/18   Loleta Rose, MD  traMADol (ULTRAM) 50 MG tablet Take 1 tablet (50 mg total) by mouth 2 (two) times daily. 10/11/17   Menshew, Charlesetta Ivory, PA-C    Allergies Patient has no known allergies.  No family history on file.  Social History Social History   Tobacco Use  . Smoking status: Current Every Day Smoker  . Smokeless tobacco: Never Used  Substance Use Topics  . Alcohol use: No  . Drug use: No    Review of Systems Constitutional: No fever/chills Eyes: No visual changes. ENT: Acute on chronic left lower rear dental pain as described above Cardiovascular: Denies chest pain. Respiratory: Denies shortness of breath. Gastrointestinal: No abdominal pain.  No nausea, no vomiting.  Musculoskeletal: Negative for neck pain.  Negative for back pain. Integumentary: Negative for rash. Neurological: Negative for headaches, focal weakness or numbness.   ____________________________________________   PHYSICAL EXAM:  VITAL SIGNS: ED Triage Vitals  Enc Vitals Group     BP 07/09/18 2304 (!) 152/90     Pulse Rate 07/09/18 2304 64     Resp 07/09/18 2304 18     Temp 07/09/18 2304 98.6 F (37 C)  Temp Source 07/09/18 2304 Oral     SpO2 07/09/18 2304 99 %     Weight 07/09/18 2249 81.6 kg (180 lb)     Height 07/09/18 2249 1.778 m (5\' 10" )     Head Circumference --      Peak Flow --      Pain Score 07/09/18 2249 10     Pain Loc --      Pain Edu? --      Excl. in GC? --     Constitutional: Alert and oriented. Well appearing and in no acute distress. Eyes: Conjunctivae are normal.  Head: Atraumatic. Nose: No congestion/rhinnorhea. Mouth/Throat: Advanced dental decay and  some swelling around the left lower molars consistent with chronic dental disease with possibility of acute infection.  There is no sign of an emergent oropharyngeal condition such as Ludwig's angina and there are no abscesses to be drained. Neck: No stridor.  No meningeal signs.   Cardiovascular: Normal rate, regular rhythm. Good peripheral circulation. Respiratory: Normal respiratory effort.  No retractions.  Skin:  Skin is warm, dry and intact. No rash noted. Psychiatric: Mood and affect are normal. Speech and behavior are normal.  ____________________________________________   LABS (all labs ordered are listed, but only abnormal results are displayed)  Labs Reviewed - No data to display ____________________________________________  EKG  None - EKG not ordered by ED physician ____________________________________________  RADIOLOGY   ED MD interpretation: No indication for imaging  Official radiology report(s): No results found.  ____________________________________________   PROCEDURES  Critical Care performed: No   Procedure(s) performed:   Procedures   ____________________________________________   INITIAL IMPRESSION / ASSESSMENT AND PLAN / ED COURSE  As part of my medical decision making, I reviewed the following data within the electronic MEDICAL RECORD NUMBER Nursing notes reviewed and incorporated, Old chart reviewed and Notes from prior ED visits    The patient has chronic dental disease with extensive caries and what appears to be an acute infection of the gum surrounding the tooth.  There is no evidence of drainable abscess nor an emergent condition requiring IV antibiotics.  I counseled the patient that he must go to the dentist for definitive treatment.  I am starting him on penicillin as documented below and I checked the [place] controlled substance database and he has no records within the last 2 years.  I will give him a short course of Percocet  and encouraged the continued use of NSAIDs as well.  Insert gave precautions.     ____________________________________________  FINAL CLINICAL IMPRESSION(S) / ED DIAGNOSES  Final diagnoses:  Dental caries  Pain, dental     MEDICATIONS GIVEN DURING THIS VISIT:  Medications  oxyCODONE-acetaminophen (PERCOCET/ROXICET) 5-325 MG per tablet 2 tablet (has no administration in time range)  penicillin v potassium (VEETID) tablet 500 mg (has no administration in time range)     ED Discharge Orders         Ordered    penicillin v potassium (VEETID) 500 MG tablet  4 times daily     07/10/18 0300    oxyCODONE-acetaminophen (PERCOCET) 5-325 MG tablet  Every 6 hours PRN     07/10/18 0300    docusate sodium (COLACE) 100 MG capsule     07/10/18 0300           Note:  This document was prepared using Dragon voice recognition software and may include unintentional dictation errors.    Loleta Rose, MD 07/10/18 [phone]

## 2018-07-10 NOTE — ED Notes (Signed)
Pt stated that he has been having dental pain on left lower side for some time now (1 year). Pt has been seen several times for the past over the year. Pt stated that he does not have insurance and cant afford to pay to get his teeth pulled.

## 2018-07-10 NOTE — ED Notes (Signed)
Dr. York Cerise in with pt at this time.

## 2018-07-18 ENCOUNTER — Emergency Department: Payer: Self-pay

## 2018-07-18 ENCOUNTER — Emergency Department
Admission: EM | Admit: 2018-07-18 | Discharge: 2018-07-18 | Disposition: A | Discharge status: Home / Self Care | Payer: Self-pay | Attending: Emergency Medicine | Admitting: Emergency Medicine

## 2018-07-18 DIAGNOSIS — F17200 Nicotine dependence, unspecified, uncomplicated: Secondary | ICD-10-CM | POA: Insufficient documentation

## 2018-07-18 DIAGNOSIS — Z79899 Other long term (current) drug therapy: Secondary | ICD-10-CM | POA: Insufficient documentation

## 2018-07-18 DIAGNOSIS — J209 Acute bronchitis, unspecified: Secondary | ICD-10-CM | POA: Insufficient documentation

## 2018-07-18 MED ORDER — PREDNISONE 20 MG PO TABS
60.0000 mg | ORAL_TABLET | Freq: Once | ORAL | Status: AC
  Administered 2018-07-18: 60 mg via ORAL
  Filled 2018-07-18: qty 3

## 2018-07-18 MED ORDER — HYDROCOD POLST-CPM POLST ER 10-8 MG/5ML PO SUER
5.0000 mL | Freq: Once | ORAL | Status: AC
  Administered 2018-07-18: 5 mL via ORAL
  Filled 2018-07-18: qty 5

## 2018-07-18 MED ORDER — ALBUTEROL SULFATE HFA 108 (90 BASE) MCG/ACT IN AERS: 2.0000 | INHALATION_SPRAY | RESPIRATORY_TRACT | 0 refills | Status: DC | PRN

## 2018-07-18 MED ORDER — PREDNISONE 20 MG PO TABS: ORAL_TABLET | ORAL | 0 refills | Status: DC

## 2018-07-18 MED ORDER — HYDROCOD POLST-CPM POLST ER 10-8 MG/5ML PO SUER: 5.0000 mL | Freq: Two times a day (BID) | ORAL | 0 refills | Status: DC

## 2018-07-18 NOTE — ED Triage Notes (Signed)
Patient c/o cough, sore throat, and rib cage pain beginning Thursday.

## 2018-07-18 NOTE — ED Notes (Signed)
Reviewed discharge instructions, follow-up care, and prescriptions with patient. Patient verbalized understanding of all information reviewed. Patient stable, with no distress noted at this time.    

## 2018-07-18 NOTE — ED Provider Notes (Signed)
[facility] Emergency Department Provider Note   ____________________________________________   First MD Initiated Contact with Patient 07/18/18 0256     (approximate)  I have reviewed the triage vital signs and the nursing notes.   HISTORY  Chief Complaint Cough    HPI Eddie Gardner is a 47 y.o. male who presents to the ED from home with a chief complaint of cough, sore throat and right rib cage pain.  Symptoms x1 week.  Complains of cough productive of yellow sputum and occasional wheezing.  Denies associated fever, chills, shortness of breath, abdominal pain, nausea or vomiting.  Denies recent travel or trauma.   Past medical history None  There are no active problems to display for this patient.   Past Surgical History:  Procedure Laterality Date  . MANDIBLE SURGERY      Prior to Admission medications   Medication Sig Start Date End Date Taking? Authorizing Provider  albuterol (PROVENTIL HFA;VENTOLIN HFA) 108 (90 Base) MCG/ACT inhaler Inhale 2 puffs into the lungs every 4 (four) hours as needed for wheezing or shortness of breath. 07/18/18   Irean Hong, MD  chlorpheniramine-HYDROcodone (TUSSIONEX PENNKINETIC ER) 10-8 MG/5ML SUER Take 5 mLs by mouth 2 (two) times daily. 07/18/18   Irean Hong, MD  docusate sodium (COLACE) 100 MG capsule Take 1 tablet once or twice daily as needed for constipation while taking narcotic pain medicine 07/10/18   Loleta Rose, MD  ibuprofen (ADVIL,MOTRIN) 800 MG tablet Take 1 tablet (800 mg total) by mouth every 6 (six) hours as needed. 07/10/17   Nita Sickle, MD  lidocaine (XYLOCAINE) 2 % solution Use as directed 20 mLs in the mouth or throat as needed for mouth pain. 07/10/17   Nita Sickle, MD  oxyCODONE-acetaminophen (PERCOCET) 5-325 MG tablet Take 1-2 tablets by mouth every 6 (six) hours as needed for severe pain. 07/10/18   Loleta Rose, MD  penicillin v potassium (VEETID) 500 MG tablet Take 1  tablet (500 mg total) by mouth 4 (four) times daily. 07/10/18   Loleta Rose, MD  predniSONE (DELTASONE) 20 MG tablet 3 tablets daily x 4 days 07/18/18   Irean Hong, MD  traMADol (ULTRAM) 50 MG tablet Take 1 tablet (50 mg total) by mouth 2 (two) times daily. 10/11/17   Menshew, Charlesetta Ivory, PA-C    Allergies Patient has no known allergies.  No family history on file.  Social History Social History   Tobacco Use  . Smoking status: Current Every Day Smoker  . Smokeless tobacco: Never Used  Substance Use Topics  . Alcohol use: No  . Drug use: No    Review of Systems  Constitutional: No fever/chills Eyes: No visual changes. ENT: No sore throat. Cardiovascular: Positive for right chest pain. Respiratory: Positive for productive cough and wheezing.  Denies shortness of breath. Gastrointestinal: No abdominal pain.  No nausea, no vomiting.  No diarrhea.  No constipation. Genitourinary: Negative for dysuria. Musculoskeletal: Negative for back pain. Skin: Negative for rash. Neurological: Negative for headaches, focal weakness or numbness.   ____________________________________________   PHYSICAL EXAM:  VITAL SIGNS: ED Triage Vitals  Enc Vitals Group     BP 07/18/18 0211 (!) 118/107     Pulse Rate 07/18/18 0211 83     Resp 07/18/18 0211 20     Temp 07/18/18 0211 98.5 F (36.9 C)     Temp Source 07/18/18 0211 Oral     SpO2 07/18/18 0211 97 %  Weight 07/18/18 0210 180 lb 12.4 oz (82 kg)     Height 07/18/18 0210 5\' 10"  (1.778 m)     Head Circumference --      Peak Flow --      Pain Score 07/18/18 0210 10     Pain Loc --      Pain Edu? --      Excl. in GC? --     Constitutional: Alert and oriented. Well appearing and in no acute distress. Eyes: Conjunctivae are normal. PERRL. EOMI. Head: Atraumatic. Nose: Congestion/rhinnorhea. Mouth/Throat: Mucous membranes are moist.  Oropharynx mildly erythematous without tonsillar swelling, exudates or peritonsillar  abscess.  There is no hoarse or muffled voice.  There is no drooling. Neck: No stridor.   Hematological/Lymphatic/Immunilogical: No cervical lymphadenopathy. Cardiovascular: Normal rate, regular rhythm. Grossly normal heart sounds.  Good peripheral circulation. Respiratory: Normal respiratory effort.  No retractions. Lungs CTAB.  Right lower ribs tender to palpation.  No crepitus.  No splinting. Gastrointestinal: Soft and nontender. No distention. No abdominal bruits. No CVA tenderness. Musculoskeletal: No lower extremity tenderness nor edema.  No joint effusions. Neurologic:  Normal speech and language. No gross focal neurologic deficits are appreciated. No gait instability. Skin:  Skin is warm, dry and intact. No rash noted. Psychiatric: Mood and affect are normal. Speech and behavior are normal.  ____________________________________________   LABS (all labs ordered are listed, but only abnormal results are displayed)  Labs Reviewed - No data to display ____________________________________________  EKG  None ____________________________________________  RADIOLOGY  ED MD interpretation: No acute cardiopulmonary process  Official radiology report(s): Dg Chest 2 View  Result Date: 07/18/2018 CLINICAL DATA:  Cough, sore throat, and rib cage pain beginning Thursday. Smoker. EXAM: CHEST - 2 VIEW COMPARISON:  09/24/2017 FINDINGS: The heart size and mediastinal contours are within normal limits. Both lungs are clear. The visualized skeletal structures are unremarkable. IMPRESSION: No active cardiopulmonary disease. Electronically Signed   By: Burman Nieves M.D.   On: 07/18/2018 02:27    ____________________________________________   PROCEDURES  Procedure(s) performed: None  Procedures  Critical Care performed: No  ____________________________________________   INITIAL IMPRESSION / ASSESSMENT AND PLAN / ED COURSE  As part of my medical decision making, I reviewed the  following data within the electronic MEDICAL RECORD NUMBER History obtained from family, Nursing notes reviewed and incorporated, Radiograph reviewed and Notes from prior ED visits   47 year old male, smoker, who presents with bronchitis.  Will start 5-day burst of prednisone, Tussionex and albuterol inhaler to use as needed.  Strict return precautions given.  Patient and spouse verbalize understanding and agree with plan of care.      ____________________________________________   FINAL CLINICAL IMPRESSION(S) / ED DIAGNOSES  Final diagnoses:  Acute bronchitis, unspecified organism     ED Discharge Orders         Ordered    predniSONE (DELTASONE) 20 MG tablet     07/18/18 0301    chlorpheniramine-HYDROcodone (TUSSIONEX PENNKINETIC ER) 10-8 MG/5ML SUER  2 times daily     07/18/18 0301    albuterol (PROVENTIL HFA;VENTOLIN HFA) 108 (90 Base) MCG/ACT inhaler  Every 4 hours PRN     07/18/18 0301           Note:  This document was prepared using Dragon voice recognition software and may include unintentional dictation errors.    Irean Hong, MD 07/18/18 [phone]

## 2018-07-18 NOTE — Discharge Instructions (Addendum)
1.  Finish steroid as prescribed (Prednisone 60 mg daily x4 days). 2.  You may take cough medicine as needed (Tussionex). 3.  Use albuterol inhaler 2 puffs every 4 hours as needed for wheezing/difficulty breathing. 4.  Return to the ER for worsening symptoms, persistent vomiting, difficulty breathing or other concerns.

## 2018-09-04 ENCOUNTER — Emergency Department
Admission: EM | Admit: 2018-09-04 | Discharge: 2018-09-04 | Disposition: A | Discharge status: Home / Self Care | Payer: Self-pay | Attending: Emergency Medicine | Admitting: Emergency Medicine

## 2018-09-04 ENCOUNTER — Other Ambulatory Visit: Payer: Self-pay

## 2018-09-04 ENCOUNTER — Encounter: Payer: Self-pay | Admitting: Emergency Medicine

## 2018-09-04 DIAGNOSIS — Y939 Activity, unspecified: Secondary | ICD-10-CM | POA: Insufficient documentation

## 2018-09-04 DIAGNOSIS — S025XXA Fracture of tooth (traumatic), initial encounter for closed fracture: Secondary | ICD-10-CM | POA: Insufficient documentation

## 2018-09-04 DIAGNOSIS — X58XXXA Exposure to other specified factors, initial encounter: Secondary | ICD-10-CM | POA: Insufficient documentation

## 2018-09-04 DIAGNOSIS — Y999 Unspecified external cause status: Secondary | ICD-10-CM | POA: Insufficient documentation

## 2018-09-04 DIAGNOSIS — Z79899 Other long term (current) drug therapy: Secondary | ICD-10-CM | POA: Insufficient documentation

## 2018-09-04 DIAGNOSIS — K0889 Other specified disorders of teeth and supporting structures: Secondary | ICD-10-CM | POA: Insufficient documentation

## 2018-09-04 DIAGNOSIS — F17200 Nicotine dependence, unspecified, uncomplicated: Secondary | ICD-10-CM | POA: Insufficient documentation

## 2018-09-04 DIAGNOSIS — Y929 Unspecified place or not applicable: Secondary | ICD-10-CM | POA: Insufficient documentation

## 2018-09-04 MED ORDER — MAGIC MOUTHWASH W/LIDOCAINE: 5.0000 mL | Freq: Four times a day (QID) | ORAL | 0 refills | Status: DC

## 2018-09-04 MED ORDER — OXYCODONE-ACETAMINOPHEN 5-325 MG PO TABS
1.0000 | ORAL_TABLET | Freq: Once | ORAL | Status: AC
  Administered 2018-09-04: 1 via ORAL
  Filled 2018-09-04: qty 1

## 2018-09-04 MED ORDER — HYDROCODONE-ACETAMINOPHEN 5-325 MG PO TABS: 1.0000 | ORAL_TABLET | ORAL | 0 refills | Status: DC | PRN

## 2018-09-04 NOTE — ED Provider Notes (Signed)
[facility] Emergency Department Provider Note  ____________________________________________  Time seen: Approximately 9:44 PM  I have reviewed the triage vital signs and the nursing notes.   HISTORY  Chief Complaint Dental Pain    HPI Eddie [facility] is a 47 y.o. male who presents the emergency department with a sharp right lower dental pain.  Patient reports that he had a tooth that broke 3 to 4 days ago.  Patient is experiencing significant pain to the area.  No swelling.  No difficulty breathing or swallowing.  Patient is requesting medicine for his pain.    History reviewed. No pertinent past medical history.  There are no active problems to display for this patient.   Past Surgical History:  Procedure Laterality Date  . MANDIBLE SURGERY      Prior to Admission medications   Medication Sig Start Date End Date Taking? Authorizing Provider  albuterol (PROVENTIL HFA;VENTOLIN HFA) 108 (90 Base) MCG/ACT inhaler Inhale 2 puffs into the lungs every 4 (four) hours as needed for wheezing or shortness of breath. 07/18/18   Irean HongSung, Jade J, MD  chlorpheniramine-HYDROcodone (TUSSIONEX PENNKINETIC ER) 10-8 MG/5ML SUER Take 5 mLs by mouth 2 (two) times daily. 07/18/18   Irean HongSung, Jade J, MD  docusate sodium (COLACE) 100 MG capsule Take 1 tablet once or twice daily as needed for constipation while taking narcotic pain medicine 07/10/18   Loleta RoseForbach, Cory, MD  HYDROcodone-acetaminophen (NORCO/VICODIN) 5-325 MG tablet Take 1 tablet by mouth every 4 (four) hours as needed for moderate pain. 09/04/18   Ramon Zanders, Delorise RoyalsJonathan D, PA-C  ibuprofen (ADVIL,MOTRIN) 800 MG tablet Take 1 tablet (800 mg total) by mouth every 6 (six) hours as needed. 07/10/17   Nita SickleVeronese, [place], MD  lidocaine (XYLOCAINE) 2 % solution Use as directed 20 mLs in the mouth or throat as needed for mouth pain. 07/10/17   Nita SickleVeronese, Rodriguez Hevia, MD  magic mouthwash w/lidocaine SOLN Take 5 mLs by mouth 4 (four) times daily.  09/04/18   Corlette Ciano, Delorise RoyalsJonathan D, PA-C  oxyCODONE-acetaminophen (PERCOCET) 5-325 MG tablet Take 1-2 tablets by mouth every 6 (six) hours as needed for severe pain. 07/10/18   Loleta RoseForbach, Cory, MD  penicillin v potassium (VEETID) 500 MG tablet Take 1 tablet (500 mg total) by mouth 4 (four) times daily. 07/10/18   Loleta RoseForbach, Cory, MD  predniSONE (DELTASONE) 20 MG tablet 3 tablets daily x 4 days 07/18/18   Irean HongSung, Jade J, MD  traMADol (ULTRAM) 50 MG tablet Take 1 tablet (50 mg total) by mouth 2 (two) times daily. 10/11/17   Menshew, Charlesetta IvoryJenise V Bacon, PA-C    Allergies Patient has no known allergies.  No family history on file.  Social History Social History   Tobacco Use  . Smoking status: Current Every Day Smoker  . Smokeless tobacco: Never Used  Substance Use Topics  . Alcohol use: No  . Drug use: No     Review of Systems  Constitutional: No fever/chills Eyes: No visual changes. No discharge ENT: Positive for right lower dental pain Cardiovascular: no chest pain. Respiratory: no cough. No SOB. Gastrointestinal: No abdominal pain.  No nausea, no vomiting.   Musculoskeletal: Negative for musculoskeletal pain. Skin: Negative for rash, abrasions, lacerations, ecchymosis. Neurological: Negative for headaches, focal weakness or numbness. 10-point ROS otherwise negative.  ____________________________________________   PHYSICAL EXAM:  VITAL SIGNS: ED Triage Vitals  Enc Vitals Group     BP 09/04/18 2113 137/68     Pulse Rate 09/04/18 2113 77     Resp 09/04/18  2113 14     Temp 09/04/18 2113 99.3 F (37.4 C)     Temp Source 09/04/18 2113 Oral     SpO2 09/04/18 2113 98 %     Weight 09/04/18 2113 180 lb (81.6 kg)     Height 09/04/18 2113 5\' 10"  (1.778 m)     Head Circumference --      Peak Flow --      Pain Score 09/04/18 2122 10     Pain Loc --      Pain Edu? --      Excl. in GC? --      Constitutional: Alert and oriented. Well appearing and in no acute distress. Eyes:  Conjunctivae are normal. PERRL. EOMI. Head: Atraumatic. ENT:      Ears:       Nose: No congestion/rhinnorhea.      Mouth/Throat: Mucous membranes are moist.  Poor dentition throughout with multiple caries, dental erosions, dental fractures.  Of note, first molar, right lower dentition is fractured into the gumline.  No surrounding erythema or edema concerning for infection.  Uvula is midline. Neck: No stridor.    Cardiovascular: Normal rate, regular rhythm. Normal S1 and S2.  Good peripheral circulation. Respiratory: Normal respiratory effort without tachypnea or retractions. Lungs CTAB. Good air entry to the bases with no decreased or absent breath sounds. Musculoskeletal: Full range of motion to all extremities. No gross deformities appreciated. Neurologic:  Normal speech and language. No gross focal neurologic deficits are appreciated.  Skin:  Skin is warm, dry and intact. No rash noted. Psychiatric: Mood and affect are normal. Speech and behavior are normal. Patient exhibits appropriate insight and judgement.   ____________________________________________   LABS (all labs ordered are listed, but only abnormal results are displayed)  Labs Reviewed - No data to display ____________________________________________  EKG   ____________________________________________  RADIOLOGY   No results found.  ____________________________________________    PROCEDURES  Procedure(s) performed:    Procedures    Medications  oxyCODONE-acetaminophen (PERCOCET/ROXICET) 5-325 MG per tablet 1 tablet (has no administration in time range)     ____________________________________________   INITIAL IMPRESSION / ASSESSMENT AND PLAN / ED COURSE  Pertinent labs & imaging results that were available during my care of the patient were reviewed by me and considered in my medical decision making (see chart for details).  Review of the  CSRS was performed in accordance of the [facility] prior  to dispensing any controlled drugs.      Patient's diagnosis is consistent with dental pain from dental fracture.  Patient with pain from fractured dentition 4 days ago.  No signs of infection..  Patient will be prescribed medication for pain.  No indication of infection requiring antibiotics.  Follow-up with dentist.  Patient is given ED precautions to return to the ED for any worsening or new symptoms.     ____________________________________________  FINAL CLINICAL IMPRESSION(S) / ED DIAGNOSES  Final diagnoses:  Pain, dental  Closed fracture of tooth, initial encounter      NEW MEDICATIONS STARTED DURING THIS VISIT:  ED Discharge Orders         Ordered    magic mouthwash w/lidocaine SOLN  4 times daily    Note to Pharmacy:  Dispense in a 1/1/1 ratio. Use lidocaine, diphenhydramine, prednisolone   09/04/18 2157    HYDROcodone-acetaminophen (NORCO/VICODIN) 5-325 MG tablet  Every 4 hours PRN     09/04/18 2157  This chart was dictated using voice recognition software/Dragon. Despite best efforts to proofread, errors can occur which can change the meaning. Any change was purely unintentional.    Lanette HampshireCuthriell, Ruthene Methvin D, PA-C 09/04/18 2159    Sharman CheekStafford, Phillip, MD 09/04/18 2352

## 2018-09-04 NOTE — ED Triage Notes (Signed)
PT arrives with complaints of dental pain due to broken tooth that started 2 weeks prior. PT in NAD

## 2019-12-18 ENCOUNTER — Encounter: Payer: Self-pay | Admitting: Emergency Medicine

## 2019-12-18 ENCOUNTER — Other Ambulatory Visit: Payer: Self-pay

## 2019-12-18 ENCOUNTER — Emergency Department
Admission: EM | Admit: 2019-12-18 | Discharge: 2019-12-19 | Disposition: A | Discharge status: Home / Self Care | Payer: Self-pay | Attending: Student | Admitting: Student

## 2019-12-18 ENCOUNTER — Emergency Department: Payer: Self-pay

## 2019-12-18 DIAGNOSIS — Y929 Unspecified place or not applicable: Secondary | ICD-10-CM | POA: Insufficient documentation

## 2019-12-18 DIAGNOSIS — Z79899 Other long term (current) drug therapy: Secondary | ICD-10-CM | POA: Insufficient documentation

## 2019-12-18 DIAGNOSIS — Y999 Unspecified external cause status: Secondary | ICD-10-CM | POA: Insufficient documentation

## 2019-12-18 DIAGNOSIS — X500XXA Overexertion from strenuous movement or load, initial encounter: Secondary | ICD-10-CM | POA: Insufficient documentation

## 2019-12-18 DIAGNOSIS — Y9367 Activity, basketball: Secondary | ICD-10-CM | POA: Insufficient documentation

## 2019-12-18 DIAGNOSIS — S8391XA Sprain of unspecified site of right knee, initial encounter: Secondary | ICD-10-CM | POA: Insufficient documentation

## 2019-12-18 DIAGNOSIS — F17210 Nicotine dependence, cigarettes, uncomplicated: Secondary | ICD-10-CM | POA: Insufficient documentation

## 2019-12-18 MED ORDER — MELOXICAM 15 MG PO TABS: 15.0000 mg | ORAL_TABLET | Freq: Every day | ORAL | 0 refills | Status: DC

## 2019-12-18 MED ORDER — HYDROCODONE-ACETAMINOPHEN 5-325 MG PO TABS
1.0000 | ORAL_TABLET | Freq: Once | ORAL | Status: DC
  Filled 2019-12-18: qty 1

## 2019-12-18 MED ORDER — HYDROCODONE-ACETAMINOPHEN 5-325 MG PO TABS: 1.0000 | ORAL_TABLET | ORAL | 0 refills | Status: DC | PRN

## 2019-12-18 NOTE — ED Provider Notes (Signed)
[facility] Emergency Department Provider Note  ____________________________________________  Time seen: Approximately 11:40 PM  I have reviewed the triage vital signs and the nursing notes.   HISTORY  Chief Complaint Knee Injury    HPI Eddie Gardner is a 49 y.o. male who presents the emergency department complaining of right knee pain.   Patient states that he was playing basketball tonight, went up, and when he landed his foot was on somebody else's.  Patient states that he did not roll his ankle but he twisted his knee.  Patient is having pain to the anterior aspect of the knee since this injury.  Unable to bear weight at this time.  No other injury or complaint.  No history of previous knee injuries.        History reviewed. No pertinent past medical history.  There are no problems to display for this patient.   Past Surgical History:  Procedure Laterality Date  . MANDIBLE SURGERY      Prior to Admission medications   Medication Sig Start Date End Date Taking? Authorizing Provider  albuterol (PROVENTIL HFA;VENTOLIN HFA) 108 (90 Base) MCG/ACT inhaler Inhale 2 puffs into the lungs every 4 (four) hours as needed for wheezing or shortness of breath. 07/18/18   Irean Hong, MD  chlorpheniramine-HYDROcodone (TUSSIONEX PENNKINETIC ER) 10-8 MG/5ML SUER Take 5 mLs by mouth 2 (two) times daily. 07/18/18   Irean Hong, MD  docusate sodium (COLACE) 100 MG capsule Take 1 tablet once or twice daily as needed for constipation while taking narcotic pain medicine 07/10/18   Loleta Rose, MD  HYDROcodone-acetaminophen (NORCO/VICODIN) 5-325 MG tablet Take 1 tablet by mouth every 4 (four) hours as needed for moderate pain. 12/18/19   Kynnedy Carreno, Delorise Royals, PA-C  ibuprofen (ADVIL,MOTRIN) 800 MG tablet Take 1 tablet (800 mg total) by mouth every 6 (six) hours as needed. 07/10/17   Nita Sickle, MD  lidocaine (XYLOCAINE) 2 % solution Use as directed 20 mLs in the mouth  or throat as needed for mouth pain. 07/10/17   Nita Sickle, MD  magic mouthwash w/lidocaine SOLN Take 5 mLs by mouth 4 (four) times daily. 09/04/18   Boleslaw Borghi, Delorise Royals, PA-C  meloxicam (MOBIC) 15 MG tablet Take 1 tablet (15 mg total) by mouth daily. 12/18/19   Nissi Doffing, Delorise Royals, PA-C  oxyCODONE-acetaminophen (PERCOCET) 5-325 MG tablet Take 1-2 tablets by mouth every 6 (six) hours as needed for severe pain. 07/10/18   Loleta Rose, MD  penicillin v potassium (VEETID) 500 MG tablet Take 1 tablet (500 mg total) by mouth 4 (four) times daily. 07/10/18   Loleta Rose, MD  predniSONE (DELTASONE) 20 MG tablet 3 tablets daily x 4 days 07/18/18   Irean Hong, MD  traMADol (ULTRAM) 50 MG tablet Take 1 tablet (50 mg total) by mouth 2 (two) times daily. 10/11/17   Menshew, Charlesetta Ivory, PA-C    Allergies Patient has no known allergies.  No family history on file.  Social History Social History   Tobacco Use  . Smoking status: Current Every Day Smoker  . Smokeless tobacco: Never Used  Substance Use Topics  . Alcohol use: No  . Drug use: No     Review of Systems  Constitutional: No fever/chills Eyes: No visual changes. No discharge ENT: No upper respiratory complaints. Cardiovascular: no chest pain. Respiratory: no cough. No SOB. Gastrointestinal: No abdominal pain.  No nausea, no vomiting.  No diarrhea.  No constipation. Musculoskeletal: Positive for right knee injury/pain  Skin: Negative for rash, abrasions, lacerations, ecchymosis. Neurological: Negative for headaches, focal weakness or numbness. 10-point ROS otherwise negative.  ____________________________________________   PHYSICAL EXAM:  VITAL SIGNS: ED Triage Vitals  Enc Vitals Group     BP 12/18/19 2248 (!) 141/81     Pulse Rate 12/18/19 2248 74     Resp 12/18/19 2248 18     Temp 12/18/19 2248 98.7 F (37.1 C)     Temp Source 12/18/19 2248 Oral     SpO2 12/18/19 2248 98 %     Weight 12/18/19 2158 180 lb  (81.6 kg)     Height 12/18/19 2158 5\' 10"  (1.778 m)     Head Circumference --      Peak Flow --      Pain Score 12/18/19 2158 9     Pain Loc --      Pain Edu? --      Excl. in GC? --      Constitutional: Alert and oriented. Well appearing and in no acute distress. Eyes: Conjunctivae are normal. PERRL. EOMI. Head: Atraumatic. ENT:      Ears:       Nose: No congestion/rhinnorhea.      Mouth/Throat: Mucous membranes are moist.  Neck: No stridor.    Cardiovascular: Normal rate, regular rhythm. Normal S1 and S2.  Good peripheral circulation. Respiratory: Normal respiratory effort without tachypnea or retractions. Lungs CTAB. Good air entry to the bases with no decreased or absent breath sounds. Musculoskeletal: Full range of motion to all extremities. No gross deformities appreciated.  Visualization of the right knee reveals no deformity, edema, abrasions or lacerations.  Patient is tender to palpation along medial joint line and lateral joint line.  No palpable abnormality.  Palpation does elicit tenderness elsewhere but the majority is along the joint line.  No ballottement.  Varus, valgus, Lachman's and McMurray's is negative.  Dorsalis pedis pulse intact distally.  Sensation intact distally. Neurologic:  Normal speech and language. No gross focal neurologic deficits are appreciated.  Skin:  Skin is warm, dry and intact. No rash noted. Psychiatric: Mood and affect are normal. Speech and behavior are normal. Patient exhibits appropriate insight and judgement.   ____________________________________________   LABS (all labs ordered are listed, but only abnormal results are displayed)  Labs Reviewed - No data to display ____________________________________________  EKG   ____________________________________________  RADIOLOGY I personally viewed and evaluated these images as part of my medical decision making, as well as reviewing the written report by the radiologist.  No  results found.  ____________________________________________    PROCEDURES  Procedure(s) performed:    Procedures    Medications - No data to display   ____________________________________________   INITIAL IMPRESSION / ASSESSMENT AND PLAN / ED COURSE  Pertinent labs & imaging results that were available during my care of the patient were reviewed by me and considered in my medical decision making (see chart for details).  Review of the [place] CSRS was performed in accordance of the [facility] prior to dispensing any controlled drugs.           Patient's diagnosis is consistent with knee sprain.  Patient presented to the emergency department complaining of right knee pain after playing basketball.  Patient landed awkwardly twisting his knee.  Imaging, exam is reassuring.  Patient has a brace already.  Patient will be given crutches for ambulation.. Patient will be discharged home with prescriptions for meloxicam and Vicodin. Patient is to follow up with orthopedics as needed  or otherwise directed. Patient is given ED precautions to return to the ED for any worsening or new symptoms.     ____________________________________________  FINAL CLINICAL IMPRESSION(S) / ED DIAGNOSES  Final diagnoses:  Sprain of right knee, unspecified ligament, initial encounter      NEW MEDICATIONS STARTED DURING THIS VISIT:  ED Discharge Orders         Ordered    HYDROcodone-acetaminophen (NORCO/VICODIN) 5-325 MG tablet  Every 4 hours PRN     12/18/19 2347    meloxicam (MOBIC) 15 MG tablet  Daily     12/18/19 2347              This chart was dictated using voice recognition software/Dragon. Despite best efforts to proofread, errors can occur which can change the meaning. Any change was purely unintentional.    Darletta Moll, PA-C 12/22/19 1634    Lilia Pro., MD 12/23/19 [phone]

## 2019-12-18 NOTE — ED Triage Notes (Signed)
Pt to triage via w/c, mask in place with no distress noted; pt reports playing bball today at 3pm and "overextended" his rt knee; denies hx of knee pani

## 2020-01-06 ENCOUNTER — Ambulatory Visit: Payer: Self-pay | Attending: Internal Medicine

## 2020-01-06 DIAGNOSIS — Z23 Encounter for immunization: Secondary | ICD-10-CM

## 2020-01-06 NOTE — Progress Notes (Signed)
   Covid-19 Vaccination Clinic  Name:  Numair Masden    MRN: [id number] DOB: 1971/01/21  01/06/2020  Mr. Nunziata was observed post Covid-19 immunization for 15 minutes without incident. He was provided with Vaccine Information Sheet and instruction to access the V-Safe system.   Mr. Behrend was instructed to call 911 with any severe reactions post vaccine: Marland Kitchen Difficulty breathing  . Swelling of face and throat  . A fast heartbeat  . A bad rash all over body  . Dizziness and weakness   Immunizations Administered    Name Date Dose VIS Date Route   Pfizer COVID-19 Vaccine 01/06/2020  4:56 PM 0.3 mL 11/05/2018 Intramuscular   Manufacturer: [employer], [employer]   Lot: [id number]   NDC: [address]-7

## 2020-11-29 IMAGING — CR DG KNEE COMPLETE 4+V*R*
4 series · 4 of 4 positions shown · non-contrast
Comparison: None.

CLINICAL DATA: Injury.

EXAM:
RIGHT KNEE - COMPLETE 4+ VIEW

[knee ap]
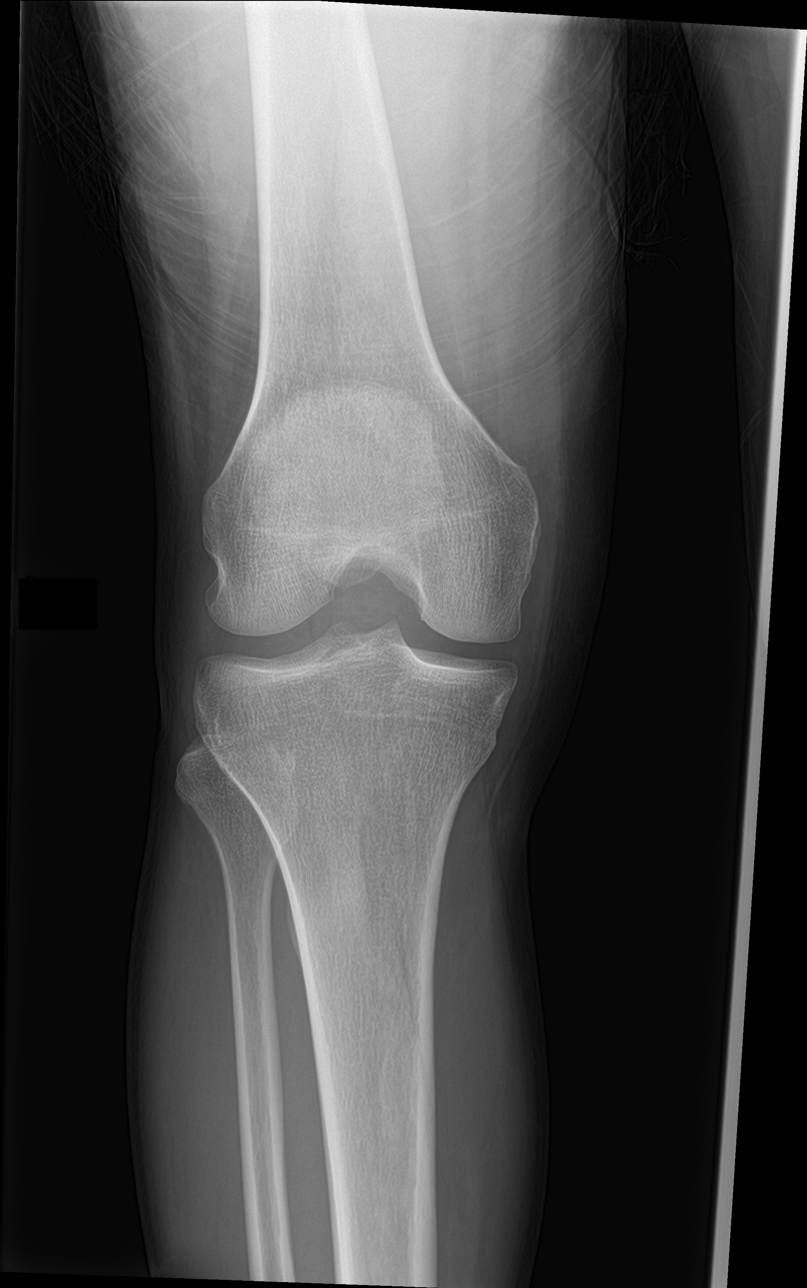

[knee obl (1 of 2)]
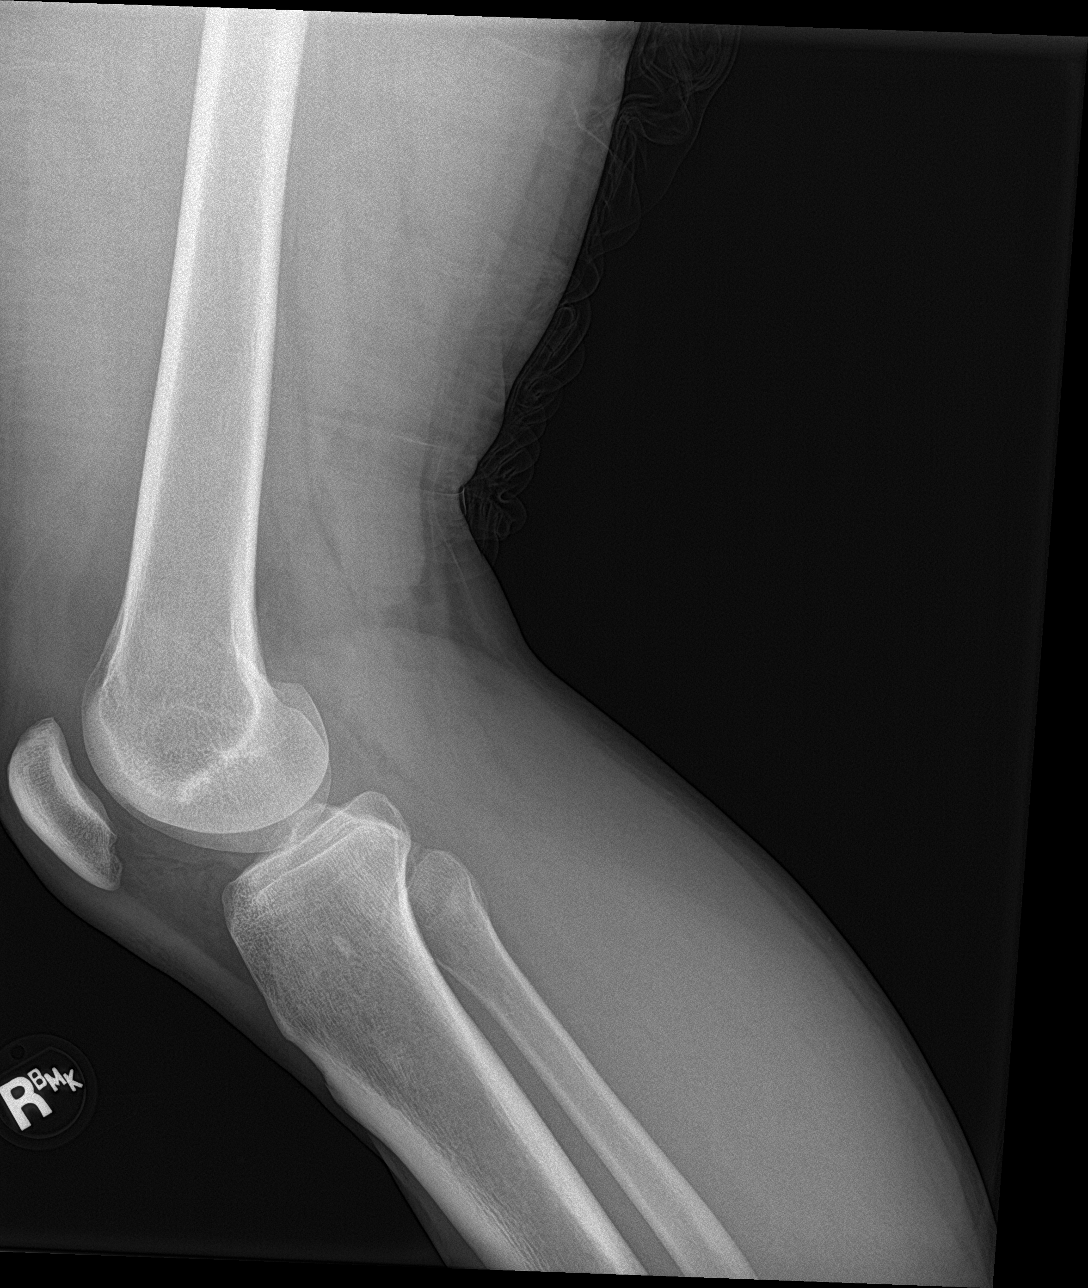

[knee obl (2 of 2)]
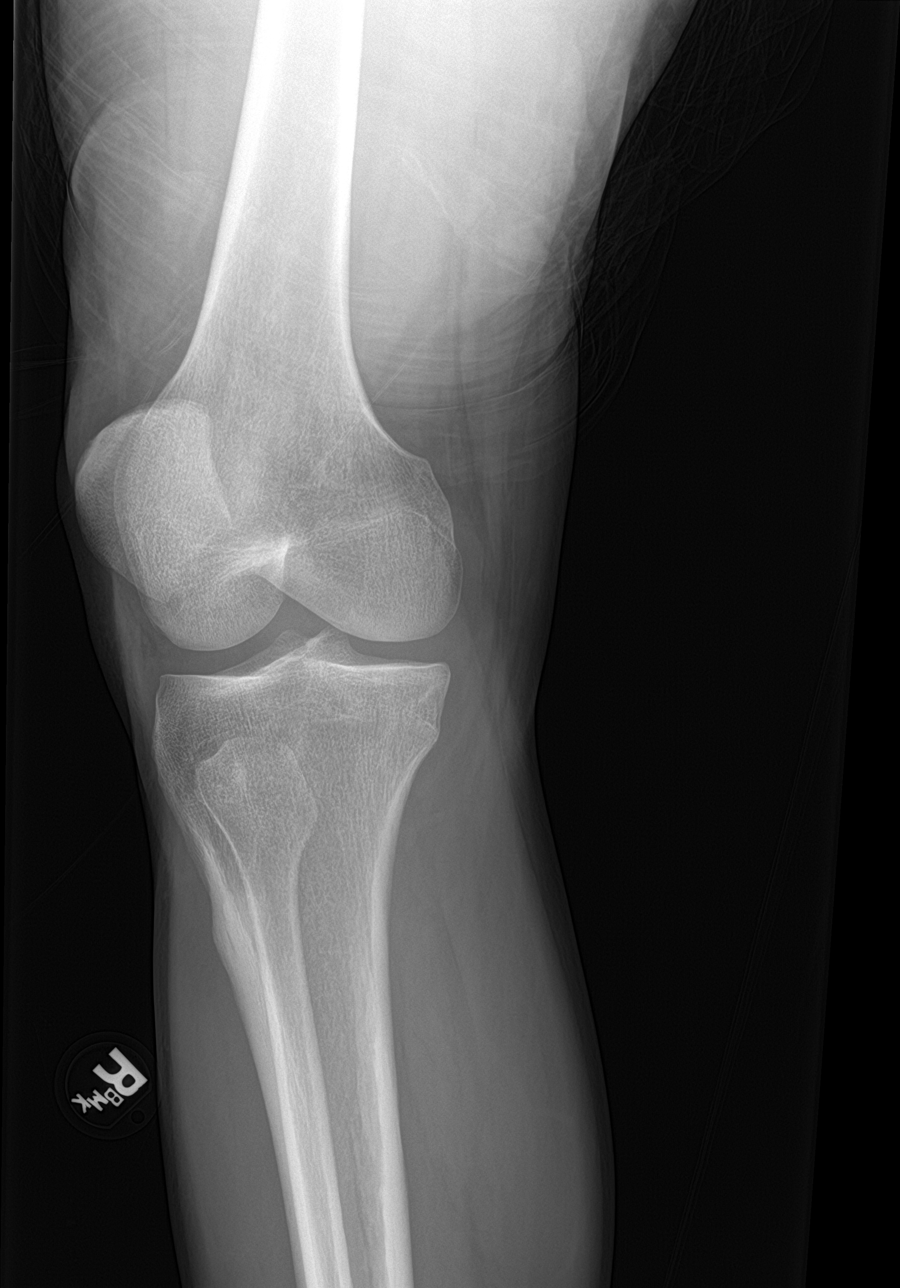

[knee lat]
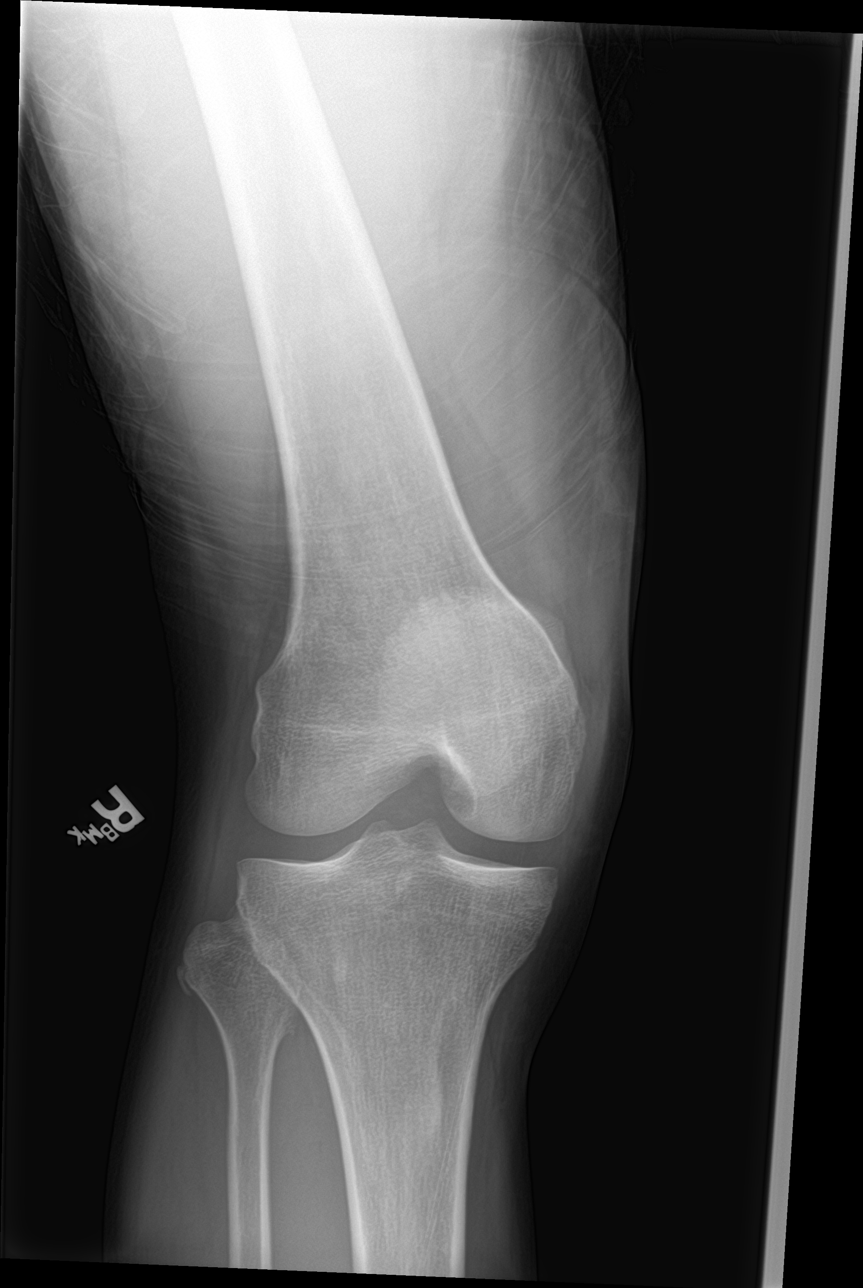

[4 of 4 positions shown; findings below may reference images not displayed]

FINDINGS: There is no acute displaced fracture. No dislocation. There is a
small suprapatellar joint effusion. There is some edema within
Hoffa's fat pad.
IMPRESSION: 1. No acute displaced fracture or dislocation.
2. Small suprapatellar joint effusion.

## 2022-08-25 DIAGNOSIS — Z5900 Homelessness unspecified: Secondary | ICD-10-CM | POA: Insufficient documentation

## 2022-08-25 DIAGNOSIS — M79606 Pain in leg, unspecified: Secondary | ICD-10-CM | POA: Diagnosis not present

## 2022-08-25 DIAGNOSIS — M79604 Pain in right leg: Secondary | ICD-10-CM | POA: Insufficient documentation

## 2022-08-25 DIAGNOSIS — M79605 Pain in left leg: Secondary | ICD-10-CM | POA: Insufficient documentation

## 2022-08-26 ENCOUNTER — Emergency Department
Admission: EM | Admit: 2022-08-26 | Discharge: 2022-08-26 | Disposition: A | Discharge status: Home / Self Care | Payer: Medicaid Other | Attending: Emergency Medicine | Admitting: Emergency Medicine

## 2022-08-26 DIAGNOSIS — M79604 Pain in right leg: Secondary | ICD-10-CM

## 2022-08-26 DIAGNOSIS — Z5900 Homelessness unspecified: Secondary | ICD-10-CM

## 2022-08-26 LAB — COMPREHENSIVE METABOLIC PANEL
ALT: 42 U/L (ref 0–44)
AST: 30 U/L (ref 15–41)
Albumin: 3.8 g/dL (ref 3.5–5.0)
Alkaline Phosphatase: 95 U/L (ref 38–126)
Anion gap: 8 (ref 5–15)
BUN: 11 mg/dL (ref 6–20)
CO2: 25 mmol/L (ref 22–32)
Calcium: 8.9 mg/dL (ref 8.9–10.3)
Chloride: 104 mmol/L (ref 98–111)
Creatinine, Ser: 1.06 mg/dL (ref 0.61–1.24)
GFR, Estimated: >60 mL/min (ref >60)
Glucose, Bld: 105 mg/dL — ABNORMAL HIGH (ref 70–99)
Potassium: 4.1 mmol/L (ref 3.5–5.1)
Sodium: 137 mmol/L (ref 135–145)
Total Bilirubin: 0.8 mg/dL (ref 0.3–1.2)
Total Protein: 6.8 g/dL (ref 6.5–8.1)

## 2022-08-26 LAB — CBC
HCT: 41.2 % (ref 39.0–52.0)
Hemoglobin: 13.5 g/dL (ref 13.0–17.0)
MCH: 29 pg (ref 26.0–34.0)
MCHC: 32.8 g/dL (ref 30.0–36.0)
MCV: 88.6 fL (ref 80.0–100.0)
Platelets: 234 10*3/uL (ref 150–400)
RBC: 4.65 MIL/uL (ref 4.22–5.81)
RDW: 13.3 % (ref 11.5–15.5)
WBC: 11.1 10*3/uL — ABNORMAL HIGH (ref 4.0–10.5)
nRBC: 0 % (ref 0.0–0.2)

## 2022-08-26 LAB — CK: Total CK: 195 U/L (ref 49–397)

## 2022-08-26 LAB — MAGNESIUM: Magnesium: 1.9 mg/dL (ref 1.7–2.4)

## 2022-08-26 MED ORDER — IBUPROFEN 800 MG PO TABS
800.0000 mg | ORAL_TABLET | Freq: Once | ORAL | Status: AC
  Administered 2022-08-26: 800 mg via ORAL
  Filled 2022-08-26: qty 1

## 2022-08-26 MED ORDER — ACETAMINOPHEN 500 MG PO TABS
1000.0000 mg | ORAL_TABLET | Freq: Once | ORAL | Status: AC
  Administered 2022-08-26: 1000 mg via ORAL
  Filled 2022-08-26: qty 2

## 2022-08-26 NOTE — ED Provider Notes (Signed)
[facility] Provider Note    Event Date/Time   First MD Initiated Contact with Patient 08/26/22 0309     (approximate)   History   Leg Pain   HPI  Eddie Gardner is a 51 y.o. male with no significant past medical history who is homeless who presents to the emergency department complaints of bilateral leg aching and cramping.  States he has been walking for a long time on his legs and has been cold.  He tried to get into multiple homeless shelters without any success.  No injury to the legs.  Given Tylenol in triage and states he is starting to feel better.  He is able to ambulate here.   History provided by patient.    History reviewed. No pertinent past medical history.  Past Surgical History:  Procedure Laterality Date   MANDIBLE SURGERY      MEDICATIONS:  Prior to Admission medications   Medication Sig Start Date End Date Taking? Authorizing Provider  albuterol (PROVENTIL HFA;VENTOLIN HFA) 108 (90 Base) MCG/ACT inhaler Inhale 2 puffs into the lungs every 4 (four) hours as needed for wheezing or shortness of breath. 07/18/18   Irean Hong, MD  chlorpheniramine-HYDROcodone (TUSSIONEX PENNKINETIC ER) 10-8 MG/5ML SUER Take 5 mLs by mouth 2 (two) times daily. 07/18/18   Irean Hong, MD  docusate sodium (COLACE) 100 MG capsule Take 1 tablet once or twice daily as needed for constipation while taking narcotic pain medicine 07/10/18   Loleta Rose, MD  HYDROcodone-acetaminophen (NORCO/VICODIN) 5-325 MG tablet Take 1 tablet by mouth every 4 (four) hours as needed for moderate pain. 12/18/19   Cuthriell, Delorise Royals, PA-C  ibuprofen (ADVIL,MOTRIN) 800 MG tablet Take 1 tablet (800 mg total) by mouth every 6 (six) hours as needed. 07/10/17   Nita Sickle, MD  lidocaine (XYLOCAINE) 2 % solution Use as directed 20 mLs in the mouth or throat as needed for mouth pain. 07/10/17   Nita Sickle, MD  magic mouthwash w/lidocaine SOLN Take 5 mLs by mouth 4 (four)  times daily. 09/04/18   Cuthriell, Delorise Royals, PA-C  meloxicam (MOBIC) 15 MG tablet Take 1 tablet (15 mg total) by mouth daily. 12/18/19   Cuthriell, Delorise Royals, PA-C  oxyCODONE-acetaminophen (PERCOCET) 5-325 MG tablet Take 1-2 tablets by mouth every 6 (six) hours as needed for severe pain. 07/10/18   Loleta Rose, MD  penicillin v potassium (VEETID) 500 MG tablet Take 1 tablet (500 mg total) by mouth 4 (four) times daily. 07/10/18   Loleta Rose, MD  predniSONE (DELTASONE) 20 MG tablet 3 tablets daily x 4 days 07/18/18   Irean Hong, MD  traMADol (ULTRAM) 50 MG tablet Take 1 tablet (50 mg total) by mouth 2 (two) times daily. 10/11/17   Menshew, Charlesetta Ivory, PA-C    Physical Exam   Triage Vital Signs: ED Triage Vitals  Enc Vitals Group     BP 08/26/22 0005 134/78     Pulse Rate 08/26/22 0005 74     Resp 08/26/22 0005 16     Temp 08/26/22 0005 99.2 F (37.3 C)     Temp Source 08/26/22 0005 Oral     SpO2 08/26/22 0005 99 %     Weight 08/26/22 0006 175 lb (79.4 kg)     Height 08/26/22 0006 5\' 10"  (1.778 m)     Head Circumference --      Peak Flow --      Pain Score 08/26/22 0005 10  Pain Loc --      Pain Edu? --      Excl. in GC? --     Most recent vital signs: Vitals:   08/26/22 0005  BP: 134/78  Pulse: 74  Resp: 16  Temp: 99.2 F (37.3 C)  SpO2: 99%    CONSTITUTIONAL: Alert and oriented and responds appropriately to questions. Well-appearing; well-nourished HEAD: Normocephalic, atraumatic EYES: Conjunctivae clear, pupils appear equal, sclera nonicteric ENT: normal nose; moist mucous membranes NECK: Supple, normal ROM CARD: RRR; S1 and S2 appreciated; no murmurs, no clicks, no rubs, no gallops RESP: Normal chest excursion without splinting or tachypnea; breath sounds clear and equal bilaterally; no wheezes, no rhonchi, no rales, no hypoxia or respiratory distress, speaking full sentences ABD/GI: Normal bowel sounds; non-distended; soft, non-tender, no rebound, no  guarding, no peritoneal signs BACK: The back appears normal EXT: Normal ROM in all joints; no deformity noted, no edema; no cyanosis, no significant calf tenderness or calf swelling, no bony deformities, no joint effusion, 2+ DP pulses bilaterally, no open wounds, no redness to the lower extremities, compartments soft SKIN: Normal color for age and race; warm; no rash on exposed skin NEURO: Moves all extremities equally, normal speech, ambulates with normal gait PSYCH: The patient's mood and manner are appropriate.   ED Results / Procedures / Treatments   LABS: (all labs ordered are listed, but only abnormal results are displayed) Labs Reviewed  CBC - Abnormal; Notable for the following components:      Result Value   WBC 11.1 (*)    All other components within normal limits  COMPREHENSIVE METABOLIC PANEL - Abnormal; Notable for the following components:   Glucose, Bld 105 (*)    All other components within normal limits  MAGNESIUM  CK     EKG:   RADIOLOGY: My personal review and interpretation of imaging:    I have personally reviewed all radiology reports.   No results found.   PROCEDURES:  Critical Care performed: No      Procedures    IMPRESSION / MDM / ASSESSMENT AND PLAN / ED COURSE  I reviewed the triage vital signs and the nursing notes.    Patient here with him and assist with complaints of bilateral leg pain.  The patient is on the cardiac monitor to evaluate for evidence of arrhythmia and/or significant heart rate changes.   DIFFERENTIAL DIAGNOSIS (includes but not limited to):   Electrolyte derangement, dehydration, rhabdomyolysis, muscle strain, muscle cramps, doubt DVT, cellulitis, septic arthritis, compartment syndrome, gout, arterial obstruction   Patient's presentation is most consistent with acute presentation with potential threat to life or bodily function.   PLAN: Will obtain CBC, BMP, magnesium level, CK.  Will give  Tylenol.   MEDICATIONS GIVEN IN ED: Medications  acetaminophen (TYLENOL) tablet 1,000 mg (1,000 mg Oral Given 08/26/22 0157)  ibuprofen (ADVIL) tablet 800 mg (800 mg Oral Given 08/26/22 0327)     ED COURSE: Normal hemoglobin, electrolytes, CK level.  Able to ambulate with no distress.  Reports feeling better after Tylenol.  Will give ibuprofen.  Nowhere to stay currently and it is quite cold outside.  Will let him talk to social work in the morning to see if they can help him get into a shelter successfully.      CONSULTS: Social work, case management consulted.   OUTSIDE RECORDS REVIEWED: No previous records for review.       FINAL CLINICAL IMPRESSION(S) / ED DIAGNOSES   Final diagnoses:  Bilateral leg pain  Homelessness     Rx / DC Orders   ED Discharge Orders     None        Note:  This document was prepared using Dragon voice recognition software and may include unintentional dictation errors.   Earnestine Shipp, Layla Maw, DO 08/26/22 0330

## 2022-08-26 NOTE — Progress Notes (Addendum)
Shelter resources attached to patients AVS. TOC signing off at this time. Please re-consult if there are any concerns.

## 2022-08-26 NOTE — ED Notes (Signed)
Patient provided with warm blanket per request. Pt sitting in lobby awaiting room. Breathing unlabored, speaking in full sentences free from sign of distress.

## 2022-08-26 NOTE — ED Notes (Signed)
Pt eloped. Pt called and he stated "I'm walking, I'm good, not coming back."
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
## 2022-08-26 NOTE — Discharge Instructions (Addendum)
You may alternate Tylenol 1000 mg every 6 hours as needed for pain, fever and Ibuprofen 800 mg every 6-8 hours as needed for pain, fever.  Please take Ibuprofen with food.  Do not take more than 4000 mg of Tylenol (acetaminophen) in a 24 hour period. ° °

## 2022-09-01 ENCOUNTER — Encounter: Payer: Self-pay | Admitting: Emergency Medicine

## 2022-09-01 ENCOUNTER — Emergency Department
Admission: EM | Admit: 2022-09-01 | Discharge: 2022-09-01 | Disposition: A | Discharge status: Home / Self Care | Payer: Medicaid Other | Attending: Emergency Medicine | Admitting: Emergency Medicine

## 2022-09-01 ENCOUNTER — Other Ambulatory Visit: Payer: Self-pay

## 2022-09-01 DIAGNOSIS — M79672 Pain in left foot: Secondary | ICD-10-CM | POA: Diagnosis not present

## 2022-09-01 DIAGNOSIS — M79671 Pain in right foot: Secondary | ICD-10-CM | POA: Diagnosis present

## 2022-09-01 DIAGNOSIS — Z5900 Homelessness unspecified: Secondary | ICD-10-CM | POA: Insufficient documentation

## 2022-09-01 MED ORDER — MELOXICAM 15 MG PO TABS: 15.0000 mg | ORAL_TABLET | Freq: Every day | ORAL | 2 refills | Status: AC

## 2022-09-01 MED ORDER — MELOXICAM 7.5 MG PO TABS
15.0000 mg | ORAL_TABLET | Freq: Every day | ORAL | Status: DC
  Administered 2022-09-01: 15 mg via ORAL
  Filled 2022-09-01: qty 2
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
## 2022-09-01 NOTE — Discharge Instructions (Signed)
[place] up [place]  [place] [facility], they may be able to help you find housing Take the meloxicam for pain as needed
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
## 2023-01-14 ENCOUNTER — Other Ambulatory Visit: Payer: Self-pay

## 2023-01-14 ENCOUNTER — Emergency Department
Admission: EM | Admit: 2023-01-14 | Discharge: 2023-01-14 | Disposition: A | Discharge status: Home / Self Care | Payer: Medicaid Other | Attending: Emergency Medicine | Admitting: Emergency Medicine

## 2023-01-14 ENCOUNTER — Encounter: Payer: Self-pay | Admitting: Emergency Medicine

## 2023-01-14 DIAGNOSIS — K029 Dental caries, unspecified: Secondary | ICD-10-CM

## 2023-01-14 DIAGNOSIS — Z5900 Homelessness unspecified: Secondary | ICD-10-CM | POA: Diagnosis not present

## 2023-01-14 DIAGNOSIS — K0889 Other specified disorders of teeth and supporting structures: Secondary | ICD-10-CM

## 2023-01-14 DIAGNOSIS — Z716 Tobacco abuse counseling: Secondary | ICD-10-CM

## 2023-01-14 MED ORDER — NICOTINE 14 MG/24HR TD PT24: 14.0000 mg | MEDICATED_PATCH | TRANSDERMAL | 3 refills | Status: AC

## 2023-01-14 MED ORDER — OXYCODONE HCL 5 MG PO TABS
5.0000 mg | ORAL_TABLET | Freq: Once | ORAL | Status: AC
  Administered 2023-01-14: 5 mg via ORAL
  Filled 2023-01-14: qty 1

## 2023-01-14 MED ORDER — ACETAMINOPHEN 325 MG PO TABS: 650.0000 mg | ORAL_TABLET | Freq: Four times a day (QID) | ORAL | 0 refills | Status: AC | PRN

## 2023-01-14 MED ORDER — IBUPROFEN 600 MG PO TABS
600.0000 mg | ORAL_TABLET | Freq: Once | ORAL | Status: AC
  Administered 2023-01-14: 600 mg via ORAL
  Filled 2023-01-14: qty 1

## 2023-01-14 MED ORDER — IBUPROFEN 200 MG PO TABS: 400.0000 mg | ORAL_TABLET | Freq: Four times a day (QID) | ORAL | 0 refills | Status: AC | PRN

## 2023-01-14 MED ORDER — AMOXICILLIN-POT CLAVULANATE 875-125 MG PO TABS
1.0000 | ORAL_TABLET | Freq: Once | ORAL | Status: AC
  Administered 2023-01-14: 1 via ORAL
  Filled 2023-01-14: qty 1

## 2023-01-14 MED ORDER — NICOTINE POLACRILEX 4 MG MT LOZG: 4.0000 mg | LOZENGE | OROMUCOSAL | 0 refills | Status: AC | PRN

## 2023-01-14 MED ORDER — AMOXICILLIN-POT CLAVULANATE 875-125 MG PO TABS: 1.0000 | ORAL_TABLET | Freq: Two times a day (BID) | ORAL | 0 refills | Status: AC

## 2023-01-14 MED ORDER — ACETAMINOPHEN 500 MG PO TABS
1000.0000 mg | ORAL_TABLET | Freq: Once | ORAL | Status: AC
  Administered 2023-01-14: 1000 mg via ORAL
  Filled 2023-01-14: qty 2

## 2023-01-14 MED ORDER — OXYCODONE HCL 5 MG PO TABS: 5.0000 mg | ORAL_TABLET | Freq: Three times a day (TID) | ORAL | 0 refills | Status: AC | PRN

## 2023-01-14 NOTE — Discharge Instructions (Signed)
Take acetaminophen 650 mg and ibuprofen 400 mg every 6 hours for pain.  Take with food. Take oxycodone as needed for severe/breakthrough pain.  Take Antibiotics for the full course as prescribed.  Call your dentist to further evaluate your dental pain.  I gave you the number of a couple of dentists that you can call for a follow-up appointment.   If you experience severe pain, high fever, difficulty swallowing or difficulty breathing come back to the emergency department right away.

## 2023-01-14 NOTE — ED Provider Notes (Addendum)
[facility] Provider Note    Event Date/Time   First MD Initiated Contact with Patient 01/14/23 [phone]     (approximate)   History   Dental Pain   HPI  Eddie Gardner is a 52 y.o. male   Past medical history of homelessness and tobacco use [facility] emergency department with left lower molar dental pain.  Has been bothering him for quite some time, worsening.  Denies fever or chills.  Denies respiratory complaints or difficulty swallowing.  No other acute medical complaints.   External Medical Documents Reviewed: Emergency department visit dated December 2023 for bilateral feet pain, homelessness      Physical Exam   Triage Vital Signs: ED Triage Vitals  Enc Vitals Group     BP 01/14/23 0355 (!) 139/98     Pulse Rate 01/14/23 0355 79     Resp 01/14/23 0355 16     Temp 01/14/23 0355 98.1 F (36.7 C)     Temp Source 01/14/23 0355 Oral     SpO2 01/14/23 0355 99 %     Weight 01/14/23 0351 165 lb (74.8 kg)     Height 01/14/23 0351 5\' 10"  (1.778 m)     Head Circumference --      Peak Flow --      Pain Score 01/14/23 0350 10     Pain Loc --      Pain Edu? --      Excl. in GC? --     Most recent vital signs: Vitals:   01/14/23 0355  BP: (!) 139/98  Pulse: 79  Resp: 16  Temp: 98.1 F (36.7 C)  SpO2: 99%    General: Awake, no distress.  CV:  Good peripheral perfusion.  Resp:  Normal effort.  Abd:  No distention.  Other:  Dental caries poor oral hygiene but no obvious abscess, no posterior oropharyngeal exudates erythema or masses.  Phonation is normal.  Neck supple with full range of motion nontoxic appearance.   ED Results / Procedures / Treatments   Labs (all labs ordered are listed, but only abnormal results are displayed) Labs Reviewed - No data to display    PROCEDURES:  Critical Care performed: No  Procedures   MEDICATIONS ORDERED IN ED: Medications  acetaminophen (TYLENOL) tablet 1,000 mg (has no administration in time  range)  ibuprofen (ADVIL) tablet 600 mg (has no administration in time range)  oxyCODONE (Oxy IR/ROXICODONE) immediate release tablet 5 mg (has no administration in time range)  amoxicillin-clavulanate (AUGMENTIN) 875-125 MG per tablet 1 tablet (has no administration in time range)   IMPRESSION / MDM / ASSESSMENT AND PLAN / ED COURSE  I reviewed the triage vital signs and the nursing notes.                                Patient's presentation is most consistent with acute complicated illness / injury requiring diagnostic workup.  Differential diagnosis includes, but is not limited to, dental caries, dental infection, abscess   The patient is on the cardiac monitor to evaluate for evidence of arrhythmia and/or significant heart rate changes.  MDM: The patient with poor dental hygiene and dental caries and old fractured tooth with with increased dental pain.  I will give him some antibiotics in the case is beginning an infection, though I see no drainable abscess.  There is no signs of airway obstruction.  He looks well.  I  offered a nerve block but he declined.  I will give him oral medications.  He will follow-up with a dentist I gave him several numbers to call.  Return with any new or worsening symptoms.    I spent 5 minutes counseling this patient on smoking cessation.  We spoke about the patient's current tobacco use, impact of smoking, assessed willingness to quit, methods for cessation including medical management and nicotine replacement therapy (which I prescribed to the patient) and advised follow-up with primary doctor to continue to address smoking cessation.      FINAL CLINICAL IMPRESSION(S) / ED DIAGNOSES   Final diagnoses:  Dental caries  Pain, dental  Encounter for smoking cessation counseling     Rx / DC Orders   ED Discharge Orders          Ordered    ibuprofen (MOTRIN IB) 200 MG tablet  Every 6 hours PRN        01/14/23 0654    acetaminophen (TYLENOL)  325 MG tablet  Every 6 hours PRN        01/14/23 0654    oxyCODONE (ROXICODONE) 5 MG immediate release tablet  Every 8 hours PRN        01/14/23 0654    amoxicillin-clavulanate (AUGMENTIN) 875-125 MG tablet  2 times daily        01/14/23 0654    nicotine polacrilex (NICOTINE MINI) 4 MG lozenge  As needed        01/14/23 0656    nicotine (NICODERM CQ - DOSED IN MG/24 HOURS) 14 mg/24hr patch  Every 24 hours        01/14/23 0656             Note:  This document was prepared using Dragon voice recognition software and may include unintentional dictation errors.    Pilar Jarvis, MD 01/14/23 1610    Pilar Jarvis, MD 01/14/23 [phone]

## 2023-01-14 NOTE — ED Triage Notes (Signed)
Pt arrived via POV with c/o L side dental pain, hx of the same, reports bad tooth on that side.

## 2023-02-06 NOTE — Congregational Nurse Program (Signed)
  Dept: [phone]   Congregational Nurse Program Note  Date of Encounter: 02/06/2023 Client to [facility] with request for antibiotic ointment. When question, client reported he had "issues" with his groin. He reported itching and chafing. Medicated powder given for possible yeast infection. RN provided information on "jock itch". Advised client about over the counter medications he could use. He is able to shower regularly at the center, educated to dry the area after a shower and apply the medication. Client appreciative of information. Francesco Runner BSN, RN Past Medical History: No past medical history on file.  Encounter Details:  CNP Questionnaire - 02/06/23 0940       Questionnaire   Ask client: Do you give verbal consent for me to treat you today? Yes    Student Assistance N/A    Location Patient Served  [facility]    Visit Setting with Client Organization    Patient Status Unhoused    Insurance Medicaid   [employer]   Insurance/Financial Assistance Referral N/A    Medication N/A    Medical Provider No    Screening Referrals Made N/A    Medical Referrals Made N/A    Medical Appointment Made N/A    Recently w/o PCP, now 1st time PCP visit completed due to CNs referral or appointment made N/A    Food Have Food Insecurities    Transportation N/A    Housing/Utilities No permanent housing    Interpersonal Safety N/A    Interventions Advocate/Support    Abnormal to Normal Screening Since Last CN Visit N/A    Screenings CN Performed N/A    Sent Client to Lab for: N/A    Did client attend any of the following based off CNs referral or appointments made? N/A    ED Visit Averted N/A    Life-Saving Intervention Made N/A

## 2023-02-27 DIAGNOSIS — F411 Generalized anxiety disorder: Secondary | ICD-10-CM | POA: Diagnosis not present

## 2023-04-16 DIAGNOSIS — F411 Generalized anxiety disorder: Secondary | ICD-10-CM | POA: Diagnosis not present

## 2023-04-18 DIAGNOSIS — F411 Generalized anxiety disorder: Secondary | ICD-10-CM | POA: Diagnosis not present

## 2023-04-24 DIAGNOSIS — F411 Generalized anxiety disorder: Secondary | ICD-10-CM | POA: Diagnosis not present

## 2023-04-27 ENCOUNTER — Other Ambulatory Visit: Payer: Self-pay

## 2023-04-27 ENCOUNTER — Encounter: Payer: Self-pay | Admitting: *Deleted

## 2023-04-27 DIAGNOSIS — Z114 Encounter for screening for human immunodeficiency virus [HIV]: Secondary | ICD-10-CM | POA: Insufficient documentation

## 2023-04-27 DIAGNOSIS — R202 Paresthesia of skin: Secondary | ICD-10-CM | POA: Insufficient documentation

## 2023-04-27 DIAGNOSIS — F411 Generalized anxiety disorder: Secondary | ICD-10-CM | POA: Diagnosis not present

## 2023-04-27 DIAGNOSIS — R200 Anesthesia of skin: Secondary | ICD-10-CM | POA: Diagnosis not present

## 2023-04-27 DIAGNOSIS — R251 Tremor, unspecified: Secondary | ICD-10-CM | POA: Diagnosis not present

## 2023-04-27 NOTE — ED Triage Notes (Signed)
Right arm tingling from the elbow down through hand for "about a year". 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 Requesting HIV testing as well.

## 2023-04-28 ENCOUNTER — Emergency Department
Admission: EM | Admit: 2023-04-28 | Discharge: 2023-04-28 | Disposition: A | Discharge status: Home / Self Care | Payer: Medicaid Other | Attending: Emergency Medicine | Admitting: Emergency Medicine

## 2023-04-28 DIAGNOSIS — Z114 Encounter for screening for human immunodeficiency virus [HIV]: Secondary | ICD-10-CM

## 2023-04-28 DIAGNOSIS — R200 Anesthesia of skin: Secondary | ICD-10-CM

## 2023-04-28 LAB — HIV ANTIBODY (ROUTINE TESTING W REFLEX): HIV Screen 4th Generation wRfx: NONREACTIVE

## 2023-04-28 NOTE — Discharge Instructions (Signed)
Please follow-up at the various clinics provided in this paperwork.  Remember that for HIV and other types of STD testing, it is best for you to go to the [facility] department.  You will need to check MyChart for the results of your HIV testing today (the information about how to access MyChart is available in this paperwork).

## 2023-04-28 NOTE — ED Provider Notes (Signed)
[facility] Provider Note    Event Date/Time   First MD Initiated Contact with Patient 04/28/23 0145     (approximate)   History   Tingling   HPI Eddie Gardner is a 52 y.o. male who provides limited history.  He says he is here for tingling that he has been having in his right arm below the elbow and his right hand.  This has been going on for about a year.  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  He is right-hand dominant.  Sometimes the tingling goes up to his shoulder.  He does not feel weak and it is not painful.  He is able to flex and extend his hands without any difficulty.  He also says he is here for HIV testing.  He said he cannot go to the health department because he works during the day.  He is evasive about why he wants testing, he said he just wants to get checked out.  He denies any needle sticks or risky sexual encounters.  He is, however, most focused on the HIV testing.     Physical Exam   Triage Vital Signs: ED Triage Vitals  Encounter Vitals Group     BP 04/27/23 2149 135/78     Systolic BP Percentile --      Diastolic BP Percentile --      Pulse Rate 04/27/23 2149 80     Resp 04/27/23 2149 16     Temp 04/27/23 2149 98.1 F (36.7 C)     Temp Source 04/27/23 2149 Oral     SpO2 04/27/23 2149 99 %     Weight 04/28/23 0143 75 kg (165 lb 5.5 oz)     Height 04/28/23 0143 1.778 m (5\' 10" )     Head Circumference --      Peak Flow --      Pain Score 04/27/23 2148 0     Pain Loc --      Pain Education --      Exclude from Growth Chart --     Most recent vital signs: Vitals:   04/27/23 2149 04/28/23 0144  BP: 135/78 132/79  Pulse: 80 (!) 58  Resp: 16 13  Temp: 98.1 F (36.7 C) 98.1 F (36.7 C)  SpO2: 99% 98%    General: Awake, no distress.  CV:  Good peripheral perfusion.  Resp:  Normal effort. Speaking easily and comfortably, no accessory  muscle usage nor intercostal retractions.   Abd:  No distention.  Other:  Muscular body habitus, no obvious physical exam abnormalities.  Patient has normal range of motion and normal strength and use of his right upper extremity when compared to the left upper extremity.  No gross deformities palpable, neurovascularly intact.  Phalen's test negative.   ED Results / Procedures / Treatments   Labs (all labs ordered are listed, but only abnormal results are displayed) Labs Reviewed  HIV ANTIBODY (ROUTINE TESTING W REFLEX)       PROCEDURES:  Critical Care performed: No  Procedures    IMPRESSION / MDM / ASSESSMENT AND PLAN / ED COURSE  I reviewed the triage vital signs and the nursing notes.                              Differential diagnosis includes, but is not limited  to, musculoskeletal strain, carpal tunnel syndrome, HIV, anxiety over medical condition.  Patient's presentation is most consistent with acute, uncomplicated illness.  Labs/studies ordered: HIV testing with reflex  Interventions/Medications given:  Medications - No data to display  (Note:  hospital course my include additional interventions and/or labs/studies not listed above.)   No appreciable physical exam abnormalities.  Doubt carpal tunnel syndrome.  Recommended NSAIDs and Ortho follow-up.  Explained health department as best place to get STD testing and treatment.  Patient is very focused on HIV testing tonight so I ordered the HIV antibody with routine testing reflex, but explained to him he needs to check MyChart for the results and follow-up at the health department.         FINAL CLINICAL IMPRESSION(S) / ED DIAGNOSES   Final diagnoses:  Numbness and tingling of right arm  Encounter for HIV (human immunodeficiency virus) test     Rx / DC Orders   ED Discharge Orders     None        Note:  This document was prepared using Dragon voice recognition software and may include  unintentional dictation errors.   Loleta Rose, MD 04/28/23 [phone]

## 2023-04-30 DIAGNOSIS — F411 Generalized anxiety disorder: Secondary | ICD-10-CM | POA: Diagnosis not present

## 2023-05-02 DIAGNOSIS — F411 Generalized anxiety disorder: Secondary | ICD-10-CM | POA: Diagnosis not present

## 2023-05-08 DIAGNOSIS — F411 Generalized anxiety disorder: Secondary | ICD-10-CM | POA: Diagnosis not present

## 2023-05-11 DIAGNOSIS — F411 Generalized anxiety disorder: Secondary | ICD-10-CM | POA: Diagnosis not present

## 2023-05-15 DIAGNOSIS — F411 Generalized anxiety disorder: Secondary | ICD-10-CM | POA: Diagnosis not present

## 2024-02-19 DIAGNOSIS — F319 Bipolar disorder, unspecified: Secondary | ICD-10-CM | POA: Diagnosis not present

## 2024-02-19 DIAGNOSIS — F6381 Intermittent explosive disorder: Secondary | ICD-10-CM | POA: Diagnosis not present

## 2024-03-11 DIAGNOSIS — F6381 Intermittent explosive disorder: Secondary | ICD-10-CM | POA: Diagnosis not present

## 2024-03-11 DIAGNOSIS — F319 Bipolar disorder, unspecified: Secondary | ICD-10-CM | POA: Diagnosis not present

## 2024-03-21 DIAGNOSIS — F6381 Intermittent explosive disorder: Secondary | ICD-10-CM | POA: Diagnosis not present

## 2024-03-21 DIAGNOSIS — F319 Bipolar disorder, unspecified: Secondary | ICD-10-CM | POA: Diagnosis not present

## 2024-03-28 DIAGNOSIS — F319 Bipolar disorder, unspecified: Secondary | ICD-10-CM | POA: Diagnosis not present

## 2024-03-28 DIAGNOSIS — F6381 Intermittent explosive disorder: Secondary | ICD-10-CM | POA: Diagnosis not present

## 2024-04-04 DIAGNOSIS — F319 Bipolar disorder, unspecified: Secondary | ICD-10-CM | POA: Diagnosis not present

## 2024-04-04 DIAGNOSIS — F6381 Intermittent explosive disorder: Secondary | ICD-10-CM | POA: Diagnosis not present

## 2024-04-10 DIAGNOSIS — F6381 Intermittent explosive disorder: Secondary | ICD-10-CM | POA: Diagnosis not present

## 2024-04-10 DIAGNOSIS — F319 Bipolar disorder, unspecified: Secondary | ICD-10-CM | POA: Diagnosis not present

## 2024-04-18 DIAGNOSIS — F6381 Intermittent explosive disorder: Secondary | ICD-10-CM | POA: Diagnosis not present

## 2024-04-18 DIAGNOSIS — F319 Bipolar disorder, unspecified: Secondary | ICD-10-CM | POA: Diagnosis not present

## 2024-04-24 DIAGNOSIS — F6381 Intermittent explosive disorder: Secondary | ICD-10-CM | POA: Diagnosis not present

## 2024-04-24 DIAGNOSIS — F319 Bipolar disorder, unspecified: Secondary | ICD-10-CM | POA: Diagnosis not present

## 2024-05-08 DIAGNOSIS — F6381 Intermittent explosive disorder: Secondary | ICD-10-CM | POA: Diagnosis not present

## 2024-05-08 DIAGNOSIS — F319 Bipolar disorder, unspecified: Secondary | ICD-10-CM | POA: Diagnosis not present

## 2024-05-15 DIAGNOSIS — F6381 Intermittent explosive disorder: Secondary | ICD-10-CM | POA: Diagnosis not present

## 2024-05-15 DIAGNOSIS — F319 Bipolar disorder, unspecified: Secondary | ICD-10-CM | POA: Diagnosis not present

## 2024-05-20 DIAGNOSIS — F6381 Intermittent explosive disorder: Secondary | ICD-10-CM | POA: Diagnosis not present

## 2024-05-20 DIAGNOSIS — F319 Bipolar disorder, unspecified: Secondary | ICD-10-CM | POA: Diagnosis not present

## 2024-05-29 DIAGNOSIS — F6381 Intermittent explosive disorder: Secondary | ICD-10-CM | POA: Diagnosis not present

## 2024-05-29 DIAGNOSIS — F319 Bipolar disorder, unspecified: Secondary | ICD-10-CM | POA: Diagnosis not present

## 2024-06-05 DIAGNOSIS — F319 Bipolar disorder, unspecified: Secondary | ICD-10-CM | POA: Diagnosis not present

## 2024-06-05 DIAGNOSIS — F6381 Intermittent explosive disorder: Secondary | ICD-10-CM | POA: Diagnosis not present

## 2024-06-12 DIAGNOSIS — F6381 Intermittent explosive disorder: Secondary | ICD-10-CM | POA: Diagnosis not present

## 2024-06-12 DIAGNOSIS — F319 Bipolar disorder, unspecified: Secondary | ICD-10-CM | POA: Diagnosis not present

## 2024-07-03 DIAGNOSIS — F6381 Intermittent explosive disorder: Secondary | ICD-10-CM | POA: Diagnosis not present

## 2024-07-03 DIAGNOSIS — F319 Bipolar disorder, unspecified: Secondary | ICD-10-CM | POA: Diagnosis not present

## 2024-07-10 DIAGNOSIS — F319 Bipolar disorder, unspecified: Secondary | ICD-10-CM | POA: Diagnosis not present

## 2024-07-10 DIAGNOSIS — F6381 Intermittent explosive disorder: Secondary | ICD-10-CM | POA: Diagnosis not present
